# Patient Record
Sex: Female | Born: 1960 | Race: White | Hispanic: No | Marital: Single | Smoking: Former smoker
Health system: Southern US, Community
[De-identification: ages and names within clinical notes are randomized; demographics above are authoritative.]

## PROBLEM LIST (undated history)

## (undated) DIAGNOSIS — T7840XA Allergy, unspecified, initial encounter: Secondary | ICD-10-CM

## (undated) DIAGNOSIS — K219 Gastro-esophageal reflux disease without esophagitis: Secondary | ICD-10-CM

## (undated) DIAGNOSIS — M1990 Unspecified osteoarthritis, unspecified site: Secondary | ICD-10-CM

## (undated) DIAGNOSIS — F419 Anxiety disorder, unspecified: Secondary | ICD-10-CM

## (undated) DIAGNOSIS — R42 Dizziness and giddiness: Secondary | ICD-10-CM

## (undated) HISTORY — DX: Unspecified osteoarthritis, unspecified site: M19.90

## (undated) HISTORY — PX: APPENDECTOMY: SHX54

## (undated) HISTORY — DX: Gastro-esophageal reflux disease without esophagitis: K21.9

## (undated) HISTORY — DX: Allergy, unspecified, initial encounter: T78.40XA

## (undated) HISTORY — DX: Anxiety disorder, unspecified: F41.9

## (undated) HISTORY — PX: TONSILLECTOMY: SUR1361

---

## 2004-09-24 ENCOUNTER — Other Ambulatory Visit: Admission: RE | Admit: 2004-09-24 | Discharge: 2004-09-24 | Payer: Self-pay | Admitting: Obstetrics and Gynecology

## 2005-12-08 ENCOUNTER — Other Ambulatory Visit: Admission: RE | Admit: 2005-12-08 | Discharge: 2005-12-08 | Payer: Self-pay | Admitting: Obstetrics and Gynecology

## 2006-12-13 ENCOUNTER — Other Ambulatory Visit: Admission: RE | Admit: 2006-12-13 | Discharge: 2006-12-13 | Payer: Self-pay | Admitting: Obstetrics and Gynecology

## 2009-04-24 ENCOUNTER — Emergency Department (HOSPITAL_COMMUNITY): Admission: EM | Admit: 2009-04-24 | Discharge: 2009-04-24 | Payer: Self-pay | Admitting: Emergency Medicine

## 2011-02-22 ENCOUNTER — Inpatient Hospital Stay (INDEPENDENT_AMBULATORY_CARE_PROVIDER_SITE_OTHER)
Admission: RE | Admit: 2011-02-22 | Discharge: 2011-02-22 | Disposition: A | Discharge status: Home / Self Care | Payer: BC Managed Care – PPO | Source: Ambulatory Visit | Attending: Emergency Medicine | Admitting: Emergency Medicine

## 2011-02-22 DIAGNOSIS — S99919A Unspecified injury of unspecified ankle, initial encounter: Secondary | ICD-10-CM

## 2011-02-22 DIAGNOSIS — I10 Essential (primary) hypertension: Secondary | ICD-10-CM

## 2011-02-22 DIAGNOSIS — T50995A Adverse effect of other drugs, medicaments and biological substances, initial encounter: Secondary | ICD-10-CM

## 2011-10-07 ENCOUNTER — Emergency Department (HOSPITAL_COMMUNITY)
Admission: EM | Admit: 2011-10-07 | Discharge: 2011-10-07 | Disposition: A | Discharge status: Home / Self Care | Payer: BC Managed Care – PPO | Attending: Emergency Medicine | Admitting: Emergency Medicine

## 2011-10-07 ENCOUNTER — Emergency Department (HOSPITAL_COMMUNITY): Payer: BC Managed Care – PPO

## 2011-10-07 ENCOUNTER — Encounter (HOSPITAL_COMMUNITY): Payer: Self-pay | Admitting: *Deleted

## 2011-10-07 DIAGNOSIS — R1031 Right lower quadrant pain: Secondary | ICD-10-CM | POA: Insufficient documentation

## 2011-10-07 DIAGNOSIS — R319 Hematuria, unspecified: Secondary | ICD-10-CM | POA: Insufficient documentation

## 2011-10-07 DIAGNOSIS — M549 Dorsalgia, unspecified: Secondary | ICD-10-CM | POA: Insufficient documentation

## 2011-10-07 DIAGNOSIS — K625 Hemorrhage of anus and rectum: Secondary | ICD-10-CM | POA: Insufficient documentation

## 2011-10-07 LAB — URINALYSIS, ROUTINE W REFLEX MICROSCOPIC
Bilirubin Urine: NEGATIVE
Glucose, UA: NEGATIVE mg/dL
Ketones, ur: NEGATIVE mg/dL
Protein, ur: NEGATIVE mg/dL

## 2011-10-07 LAB — URINE MICROSCOPIC-ADD ON

## 2011-10-07 NOTE — ED Provider Notes (Signed)
Medical screening examination/treatment/procedure(s) were performed by non-physician practitioner and as supervising physician I was immediately available for consultation/collaboration.  Everett Ehrler L Merri Dimaano, MD 10/07/11 1937 

## 2011-10-07 NOTE — ED Notes (Addendum)
Pt informed that during after urination blood was present on toilet tissue, will notify  Lauralee Evener, PA

## 2011-10-07 NOTE — ED Provider Notes (Signed)
History     CSN: [id number]  Arrival date & time 10/07/11  4098   First MD Initiated Contact with Patient 10/07/11 [phone]      Chief Complaint  Patient presents with  . Rectal Bleeding    (Consider location/radiation/quality/duration/timing/severity/associated sxs/prior treatment) Patient is a 51 y.o. female presenting with hematuria. The history is provided by the patient.  Hematuria This is a new problem. The current episode started today. The problem is unchanged. She describes the hematuria as gross hematuria. She reports no clotting in her urine stream. Pertinent negatives include no chills or fever. (She has seen blood in her urine x 2 today. Initially had RLQ abdominal pain that moved into back before almost completely resolving. No fever, nausea or vomiting. She has a history of kidney stones remotely. She reports she did not have any bowel movements earlier today when seeing blood, only urination.)    History reviewed. No pertinent past medical history.  Past Surgical History  Procedure Date  . Appendectomy     History reviewed. No pertinent family history.  History  Substance Use Topics  . Smoking status: Never Smoker   . Smokeless tobacco: Not on file  . Alcohol Use: No    OB History    Grav Para Term Preterm Abortions TAB SAB Ect Mult Living                  Review of Systems  Constitutional: Negative for fever and chills.  HENT: Negative.   Respiratory: Negative.   Cardiovascular: Negative.   Gastrointestinal: Positive for hematochezia. Negative for blood in stool.  Genitourinary: Positive for hematuria.  Musculoskeletal: Negative.   Skin: Negative.   Neurological: Negative.     Allergies  Neosporin  Home Medications   Current Outpatient Rx  Name Route Sig Dispense Refill  . CALCIUM CARBONATE ANTACID 500 MG PO CHEW Oral Chew 2 tablets by mouth daily as needed. Stomach pain    . CETIRIZINE HCL 5 MG PO TABS Oral Take 5 mg by mouth daily.    .  ADULT MULTIVITAMIN W/MINERALS CH Oral Take 1 tablet by mouth daily.    Marland Kitchen NAPROXEN SODIUM 220 MG PO TABS Oral Take 220 mg by mouth daily.      BP 124/53  Pulse 68  Temp(Src) 98.8 F (37.1 C) (Oral)  Resp 20  SpO2 97%  Physical Exam  Constitutional: She appears well-developed and well-nourished.  HENT:  Head: Normocephalic.  Neck: Normal range of motion. Neck supple.  Cardiovascular: Normal rate and regular rhythm.   Pulmonary/Chest: Effort normal and breath sounds normal.  Abdominal: Soft. Bowel sounds are normal. There is no tenderness. There is no rebound and no guarding.  Musculoskeletal: Normal range of motion.  Neurological: She is alert. No cranial nerve deficit.  Skin: Skin is warm and dry. No rash noted.  Psychiatric: She has a normal mood and affect.    ED Course  Procedures (including critical care time)   Labs Reviewed  URINALYSIS, ROUTINE W REFLEX MICROSCOPIC   No results found.   No diagnosis found.    MDM  Suspect with negative CT for stone, hematuria and pain that are improved and negative urine for infection, that patient passed a kidney stone. Will discharge home with urology follow up.  Rodena Medin, PA-C 10/07/11 1349

## 2011-10-07 NOTE — ED Notes (Signed)
Pt reports right lower quadrant pain that started this am. States she went to the restroom and bright blood in the toilet. Pt states i haven't had my period in 4 years so I put a tampon on in and went to the bathroom again and there was still blood in the toilet. Pt reports mild dizziness. Denies chest pain or palpitations.

## 2011-10-07 NOTE — ED Notes (Addendum)
Pt c/o rectal bleeding that started this am.  Pt denies history of hemorroids.  Denies menstral cycle pt hasnt had period in 4 yrs.  Pt c/o lower abdominal pain and back pain 3/10

## 2011-10-07 NOTE — ED Notes (Signed)
Visual inspection of rectum shows no sign of active bleeding or hemorroids Pt states she has neve had colonoscopy.  Denies history of rectal bleeding in the past.

## 2013-04-12 ENCOUNTER — Emergency Department (HOSPITAL_COMMUNITY)
Admission: EM | Admit: 2013-04-12 | Discharge: 2013-04-12 | Disposition: A | Discharge status: Home / Self Care | Payer: Private Health Insurance - Indemnity | Attending: Emergency Medicine | Admitting: Emergency Medicine

## 2013-04-12 ENCOUNTER — Emergency Department (HOSPITAL_COMMUNITY): Payer: Private Health Insurance - Indemnity

## 2013-04-12 ENCOUNTER — Encounter (HOSPITAL_COMMUNITY): Payer: Self-pay | Admitting: Emergency Medicine

## 2013-04-12 DIAGNOSIS — Z9889 Other specified postprocedural states: Secondary | ICD-10-CM | POA: Insufficient documentation

## 2013-04-12 DIAGNOSIS — R109 Unspecified abdominal pain: Secondary | ICD-10-CM

## 2013-04-12 DIAGNOSIS — Z87891 Personal history of nicotine dependence: Secondary | ICD-10-CM | POA: Insufficient documentation

## 2013-04-12 DIAGNOSIS — M549 Dorsalgia, unspecified: Secondary | ICD-10-CM

## 2013-04-12 DIAGNOSIS — Z87442 Personal history of urinary calculi: Secondary | ICD-10-CM | POA: Insufficient documentation

## 2013-04-12 DIAGNOSIS — M545 Low back pain, unspecified: Secondary | ICD-10-CM | POA: Insufficient documentation

## 2013-04-12 DIAGNOSIS — R1031 Right lower quadrant pain: Secondary | ICD-10-CM | POA: Insufficient documentation

## 2013-04-12 DIAGNOSIS — N390 Urinary tract infection, site not specified: Secondary | ICD-10-CM | POA: Insufficient documentation

## 2013-04-12 LAB — CBC WITH DIFFERENTIAL/PLATELET
Basophils Absolute: 0 10*3/uL (ref 0.0–0.1)
HCT: 43.4 % (ref 36.0–46.0)
Hemoglobin: 14.9 g/dL (ref 12.0–15.0)
Lymphocytes Relative: 27 % (ref 12–46)
Monocytes Absolute: 0.6 10*3/uL (ref 0.1–1.0)
Monocytes Relative: 8 % (ref 3–12)
Neutro Abs: 4.9 10*3/uL (ref 1.7–7.7)
Neutrophils Relative %: 62 % (ref 43–77)
WBC: 7.9 10*3/uL (ref 4.0–10.5)

## 2013-04-12 LAB — BASIC METABOLIC PANEL
BUN: 13 mg/dL (ref 6–23)
CO2: 23 mEq/L (ref 19–32)
Chloride: 105 mEq/L (ref 96–112)
Creatinine, Ser: 0.68 mg/dL (ref 0.50–1.10)
Potassium: 3.8 mEq/L (ref 3.5–5.1)

## 2013-04-12 LAB — URINALYSIS, ROUTINE W REFLEX MICROSCOPIC
Glucose, UA: NEGATIVE mg/dL
Ketones, ur: NEGATIVE mg/dL
Nitrite: NEGATIVE
Specific Gravity, Urine: 1.016 (ref 1.005–1.030)
pH: 5.5 (ref 5.0–8.0)

## 2013-04-12 LAB — URINE MICROSCOPIC-ADD ON

## 2013-04-12 NOTE — ED Notes (Signed)
Pt c/o RLQ abd pain that started on Monday, reports she went to urgent care and was told she had a kidney infection. Was given antibiotics, hasn't finished the dose yet. Reports she has been getting diarrhea and now the pain has worsened. Pt in nad, skin warm and dry, resp e/u. Pt ambulatory.

## 2013-04-12 NOTE — ED Provider Notes (Signed)
CSN: [id number]     Arrival date & time 04/12/13  4098 History     First MD Initiated Contact with Patient 04/12/13 0754     Chief Complaint  Patient presents with  . Abdominal Pain  . Urinary Tract Infection   (Consider location/radiation/quality/duration/timing/severity/associated sxs/prior Treatment) HPI Comments: Caroline Leonard is a 52 y.o. Female who presents for evaluation of abdominal pain. The pain is in the right lower quadrant. It is persistent. She was evaluated at an urgent care center 3 days ago and started on Cipro for UTI. She denies dysuria, urinary frequency, or hematuria. She has had a kidney stone in the past. She is status post appendectomy. She denies fever, chills, nausea, vomiting, weakness, or dizziness. She denies trauma. The pain is dull in nature. There are no other known modifying factors.  Patient is a 52 y.o. female presenting with abdominal pain and urinary tract infection. The history is provided by the patient.  Abdominal Pain Urinary Tract Infection Associated symptoms include abdominal pain.    History reviewed. No pertinent past medical history. Past Surgical History  Procedure Laterality Date  . Appendectomy    . Tonsillectomy     No family history on file. History  Substance Use Topics  . Smoking status: Former Games developer  . Smokeless tobacco: Not on file  . Alcohol Use: Yes     Comment: occasionally.   OB History   Grav Para Term Preterm Abortions TAB SAB Ect Mult Living                 Review of Systems  Gastrointestinal: Positive for abdominal pain.  All other systems reviewed and are negative.    Allergies  Neosporin  Home Medications   Current Outpatient Rx  Name  Route  Sig  Dispense  Refill  . cetirizine (ZYRTEC) 5 MG tablet   Oral   Take 5 mg by mouth daily.         . ciprofloxacin (CIPRO) 500 MG tablet   Oral   Take 500 mg by mouth 2 (two) times daily.         . metroNIDAZOLE (FLAGYL) 250 MG tablet   Oral  Take 250 mg by mouth 3 (three) times daily.          BP 120/80  Pulse 77  Temp(Src) 97.5 F (36.4 C) (Oral)  Resp 18  SpO2 95% Physical Exam  Nursing note and vitals reviewed. Constitutional: She is oriented to person, place, and time. She appears well-developed and well-nourished.  Overweight  HENT:  Head: Normocephalic and atraumatic.  Eyes: Conjunctivae and EOM are normal. Pupils are equal, round, and reactive to light.  Neck: Normal range of motion and phonation normal. Neck supple.  Cardiovascular: Normal rate, regular rhythm and intact distal pulses.   Pulmonary/Chest: Effort normal and breath sounds normal. She exhibits no tenderness.  Abdominal: Soft. She exhibits no distension. There is tenderness (Mild right lower quadrant tenderness). There is no guarding.  Genitourinary:  Mild right costovertebral angle tenderness to percussion  Musculoskeletal: Normal range of motion. She exhibits no edema and no tenderness.  Mild right lumbar tenderness to palpation. Normal range of motion lumbar.  Neurological: She is alert and oriented to person, place, and time. She has normal strength. She exhibits normal muscle tone.  Skin: Skin is warm and dry.  Psychiatric: She has a normal mood and affect. Her behavior is normal. Judgment and thought content normal.    ED Course   Procedures (including critical  care time)  Labs Reviewed  BASIC METABOLIC PANEL - Abnormal; Notable for the following:    Glucose, Bld 115 (*)    All other components within normal limits  URINALYSIS, ROUTINE W REFLEX MICROSCOPIC - Abnormal; Notable for the following:    Leukocytes, UA TRACE (*)    All other components within normal limits  URINE CULTURE  CBC WITH DIFFERENTIAL  URINE MICROSCOPIC-ADD ON   Ct Abdomen Wo Contrast  04/12/2013   *RADIOLOGY REPORT*  Clinical Data:  Abdominal pain  CT  ABDOMEN AND PELVIS WITHOUT CONTRAST  Technique:  Multidetector CT imaging of the abdomen and pelvis was  performed following the standard protocol without IV contrast. Oral contrast was not administered.  Comparison:  October 28, 2011  Findings: There is a calcified granuloma in the posterior segment right upper lobe.  There is mild bibasilar scarring.  There is a small calcified granuloma in the posterior segment of the right lobe of the liver near the dome.  A second small granuloma is noted in the anterior segment left lobe of the liver, midportion.  No other liver lesions are identified on this non intravenous contrast enhanced study.  There are scattered splenic granulomas.  Spleen otherwise appears normal.  Pancreas and adrenals appear normal.  Kidneys bilaterally show no appreciable mass, calculus, or hydronephrosis on either side.  There is no ureteral calculus or ureterectasis on either side.  In the pelvis, there is no mass or fluid collection.  Appendix is absent.  There is no bowel obstruction.  No free air or portal venous air.  There is no ascites, adenopathy, or abscess in the abdomen or pelvis.  There is atherosclerotic change in the aorta but no aneurysm.  There are no blastic or lytic bone lesions.  IMPRESSION: No renal or ureteral calculus.  No hydronephrosis.  Appendix absent.  No bowel obstruction.  No mesenteric inflammation or abscess.  Evidence of prior granulomatous disease.   Original Report Authenticated By: Bretta Bang, M.D.   1. Abdominal pain   2. Back pain     MDM  Nonspecific abdominal and right lower back pain. No evidence for ureteral or kidney urolithiasis. No evidence for colitis, UTI, traumatic injury, shingles, or lumbar myelopathy. Her pain is nonspecific. Doubt metabolic instability, serious bacterial infection or impending vascular collapse; the patient is stable for discharge.  Nursing Notes Reviewed/ Care Coordinated, and agree without changes. Applicable Imaging Reviewed.  Interpretation of Laboratory Data incorporated into ED treatment    Plan: Home  Medications-  OTC analgesics ; Home Treatments and ZOX:WRUE, watch for progressive symptoms- ; return here if the recommended treatment, does not improve the symptoms; Recommended follow up- PCP of choice in one week for repeat urine testing and reevaluation for symptom persistence and arrange ongoing management, at that time.    Flint Melter, MD 04/12/13 [phone]

## 2013-04-13 LAB — URINE CULTURE
Colony Count: NO GROWTH
Culture: NO GROWTH

## 2013-06-06 ENCOUNTER — Ambulatory Visit (INDEPENDENT_AMBULATORY_CARE_PROVIDER_SITE_OTHER): Payer: Private Health Insurance - Indemnity | Admitting: General Surgery

## 2013-06-06 ENCOUNTER — Encounter (INDEPENDENT_AMBULATORY_CARE_PROVIDER_SITE_OTHER): Payer: Self-pay | Admitting: General Surgery

## 2013-06-06 VITALS — BP 128/74 | HR 68 | Temp 97.6°F | Resp 16 | Ht 66.0 in | Wt 216.8 lb

## 2013-06-06 DIAGNOSIS — R1031 Right lower quadrant pain: Secondary | ICD-10-CM

## 2013-06-06 DIAGNOSIS — G8929 Other chronic pain: Secondary | ICD-10-CM | POA: Insufficient documentation

## 2013-06-06 NOTE — Patient Instructions (Addendum)
The pain and lump that you feel in your right lower quadrant seems more related to your old appendectomy scar than an inguinal hernia. They are very close to each other,  however. I cannot push anything back in and so I am not completely sure this is a hernia.  The CT scan of your abdomen and pelvis from August is completely normal. There is no evidence of incisional hernia or inguinal hernia. There is no obvious disease within the abdomen that would cause this pain.  Your recent colonoscopy is also normal. No abnormality of the colon that would cause this problem.  There is no indication for surgery at this point in time.  Return to see Dr. Derrell Lolling in 6 months for reevaluation.Caroline Leonard

## 2013-06-06 NOTE — Progress Notes (Signed)
Patient ID: Caroline Leonard, female   DOB: Jan 02, 1961, 52 y.o.   MRN: [id number]  Chief Complaint  Patient presents with  . New Evaluation    eval RIH    HPI Caroline Leonard is a 52 y.o. female.  She is referred to me by Dr. Charlott Rakes for evaluation of right lower quadrant abdominal pain, suspect hernia.   The patient states that she has had a pain and a lump in her right lower quadrant since July. It doesn't pop in and out. No change in diet, appetite, or bowel habits.  She went to the emergency room and apparently had a CT scan in August. I reviewed this extensively. The abdominal wall muscles are intact. There is no incisional hernia from the right lower quadrant appendectomy scar, and there is no evidence of inguinal hernia. There is no inflammation of the abdominal wall. There is no intra-abdominal pathology seen.  She saw Dr. Bosie Clos. He did a colonoscopy should revealed only hyperplastic polyps.  At that point she was referred to me to see if she had a hernia.  She has no prior history of hernia. She is otherwise healthy other than sinus problems, appendectomy at age 13, and mild obesity. HPI  History reviewed. No pertinent past medical history.  Past Surgical History  Procedure Laterality Date  . Appendectomy    . Tonsillectomy      History reviewed. No pertinent family history.  Social History History  Substance Use Topics  . Smoking status: Former Games developer  . Smokeless tobacco: Never Used  . Alcohol Use: Yes     Comment: occasionally.    Allergies  Allergen Reactions  . Neosporin [Neomycin-Polymyxin-Gramicidin] Other (See Comments)    blisters    Current Outpatient Prescriptions  Medication Sig Dispense Refill  . cetirizine (ZYRTEC) 5 MG tablet Take 5 mg by mouth daily.       No current facility-administered medications for this visit.    Review of Systems Review of Systems  Constitutional: Negative for fever, chills and unexpected weight change.   HENT: Negative for hearing loss, congestion, sore throat, trouble swallowing and voice change.   Eyes: Negative for visual disturbance.  Respiratory: Negative for cough and wheezing.   Cardiovascular: Negative for chest pain, palpitations and leg swelling.  Gastrointestinal: Positive for abdominal pain. Negative for nausea, vomiting, diarrhea, constipation, blood in stool, abdominal distention and anal bleeding.  Genitourinary: Negative for hematuria, vaginal bleeding and difficulty urinating.  Musculoskeletal: Negative for arthralgias.  Skin: Negative for rash and wound.  Neurological: Negative for seizures, syncope and headaches.  Hematological: Negative for adenopathy. Does not bruise/bleed easily.  Psychiatric/Behavioral: Negative for confusion.    Blood pressure 128/74, pulse 68, temperature 97.6 F (36.4 C), temperature source Temporal, resp. rate 16, height 5\' 6"  (1.676 m), weight 216 lb 12.8 oz (98.34 kg).  Physical Exam Physical Exam  Constitutional: She is oriented to person, place, and time. She appears well-developed and well-nourished. No distress.  Mild obesity. Weight 216. Height 5 feet 6. No distress. Friendly and cooperative.  HENT:  Head: Normocephalic and atraumatic.  Nose: Nose normal.  Mouth/Throat: No oropharyngeal exudate.  Eyes: Conjunctivae and EOM are normal. Pupils are equal, round, and reactive to light. Left eye exhibits no discharge. No scleral icterus.  Neck: Neck supple. No JVD present. No tracheal deviation present. No thyromegaly present.  Cardiovascular: Normal rate, regular rhythm, normal heart sounds and intact distal pulses.   No murmur heard. Pulmonary/Chest: Effort normal and breath sounds normal. No  respiratory distress. She has no wheezes. She has no rales. She exhibits no tenderness.  Abdominal: Soft. Bowel sounds are normal. She exhibits no distension and no mass. There is no tenderness. There is no rebound and no guarding.  Well-healed  right lower quadrant scar. Examined supine and standing. I do not feel an inguinal hernia. I do not feel an incisional hernia. There is subjective tenderness in the medial aspect of the appendectomy scar. Perhaps a little bit of thickening. No hernia detected.  Musculoskeletal: She exhibits no edema and no tenderness.  Lymphadenopathy:    She has no cervical adenopathy.  Neurological: She is alert and oriented to person, place, and time. She exhibits normal muscle tone. Coordination normal.  Skin: Skin is warm. No rash noted. She is not diaphoretic. No erythema. No pallor.  Psychiatric: She has a normal mood and affect. Her behavior is normal. Judgment and thought content normal.    Data Reviewed CT scan in August. Colonoscopy report. Dr. Marge Duncans office notes.  Assessment    Right lower quadrant abdominal pain of uncertain etiology. No evidence of hernia. No evidence of inflammatory process of the abdominal wall. No evidence of this inflammatory or obstructive or neoplastic process within the abdominal viscera. Question  musculoskeletal strain  Mild obesity  Remote history of an appendectomy     Plan    There is no indication for surgical intervention or further testing at this time.  Return to see me in 6 months to make sure that she's not developing a hernia or some other abnormality of the abdominal wall.        Angelia Mould. Derrell Lolling, M.D., [facility] General and Minimally invasive Surgery Breast and Colorectal Surgery Office:   [phone] Pager:   [phone]  06/06/2013, 11:29 AM

## 2013-06-21 ENCOUNTER — Encounter (INDEPENDENT_AMBULATORY_CARE_PROVIDER_SITE_OTHER): Payer: Self-pay

## 2013-07-04 ENCOUNTER — Encounter (INDEPENDENT_AMBULATORY_CARE_PROVIDER_SITE_OTHER): Payer: Private Health Insurance - Indemnity | Admitting: General Surgery

## 2013-10-22 ENCOUNTER — Emergency Department (INDEPENDENT_AMBULATORY_CARE_PROVIDER_SITE_OTHER)
Admission: EM | Admit: 2013-10-22 | Discharge: 2013-10-22 | Disposition: A | Discharge status: Home / Self Care | Payer: Private Health Insurance - Indemnity | Source: Home / Self Care | Attending: Family Medicine | Admitting: Family Medicine

## 2013-10-22 ENCOUNTER — Emergency Department (INDEPENDENT_AMBULATORY_CARE_PROVIDER_SITE_OTHER): Payer: Private Health Insurance - Indemnity

## 2013-10-22 ENCOUNTER — Encounter (HOSPITAL_COMMUNITY): Payer: Self-pay | Admitting: Emergency Medicine

## 2013-10-22 DIAGNOSIS — J069 Acute upper respiratory infection, unspecified: Secondary | ICD-10-CM

## 2013-10-22 MED ORDER — IPRATROPIUM BROMIDE 0.06 % NA SOLN: 2.0000 | Freq: Four times a day (QID) | NASAL | Status: DC

## 2013-10-22 MED ORDER — IPRATROPIUM BROMIDE 0.02 % IN SOLN
0.5000 mg | Freq: Once | RESPIRATORY_TRACT | Status: AC
  Administered 2013-10-22: 0.5 mg via RESPIRATORY_TRACT

## 2013-10-22 MED ORDER — ALBUTEROL SULFATE (2.5 MG/3ML) 0.083% IN NEBU
5.0000 mg | INHALATION_SOLUTION | Freq: Once | RESPIRATORY_TRACT | Status: AC
  Administered 2013-10-22: 5 mg via RESPIRATORY_TRACT

## 2013-10-22 MED ORDER — IPRATROPIUM BROMIDE 0.02 % IN SOLN
RESPIRATORY_TRACT | Status: AC
  Filled 2013-10-22: qty 2.5

## 2013-10-22 MED ORDER — ALBUTEROL SULFATE HFA 108 (90 BASE) MCG/ACT IN AERS
1.0000 | INHALATION_SPRAY | Freq: Four times a day (QID) | RESPIRATORY_TRACT | Status: DC | PRN
Start: 2013-10-22 — End: 2024-05-10

## 2013-10-22 MED ORDER — ALBUTEROL SULFATE (2.5 MG/3ML) 0.083% IN NEBU
INHALATION_SOLUTION | RESPIRATORY_TRACT | Status: AC
  Filled 2013-10-22: qty 6

## 2013-10-22 NOTE — Discharge Instructions (Signed)
Your chest xray was normal. Please use albuterol and atrovent as prescribed in addition to those medications you are already taking and follow up with your doctor if your symptoms do not improve.

## 2013-10-22 NOTE — ED Notes (Signed)
C/o bronchitis   States she was seen Wednesday at a urgent care and was dx with Bronchitis.  Did receive medication   States she is not feeling any better   States she is having sob, sneezing, diarrhea, right ear pressure and dry cough Patient is still taking antibiotic

## 2013-10-22 NOTE — ED Provider Notes (Signed)
CSN: [id number]     Arrival date & time 10/22/13  1119 History   First MD Initiated Contact with Patient 10/22/13 1212     Chief Complaint  Patient presents with  . Bronchitis     (Consider location/radiation/quality/duration/timing/severity/associated sxs/prior Treatment) HPI Comments: Patient presents with 1 week history of cough with associated nasal congestion. States she was seen for same at [facility] on 10/18/2013 and diagnosed with sinusitis and bronchitis and prescribed amoxicillin, prednisone and cough suppressant. States she still feels unwell and comes here for re-evaluation. Patient is former smoker. Denies fever or hemoptysis. No N/V but states some diarrhea since beginning amoxicillin.  PCP: Dr. Marcy Siren  The history is provided by the patient.    History reviewed. No pertinent past medical history. Past Surgical History  Procedure Laterality Date  . Appendectomy    . Tonsillectomy     History reviewed. No pertinent family history. History  Substance Use Topics  . Smoking status: Former Games developer  . Smokeless tobacco: Never Used  . Alcohol Use: Yes     Comment: occasionally.   OB History   Grav Para Term Preterm Abortions TAB SAB Ect Mult Living                 Review of Systems  All other systems reviewed and are negative.      Allergies  Neosporin  Home Medications   Current Outpatient Rx  Name  Route  Sig  Dispense  Refill  . cetirizine (ZYRTEC) 5 MG tablet   Oral   Take 5 mg by mouth daily.          BP 138/86  Pulse 75  Temp(Src) 97.9 F (36.6 C) (Oral)  Resp 19  SpO2 96% Physical Exam  Nursing note and vitals reviewed. Constitutional: She is oriented to person, place, and time. She appears well-developed and well-nourished. No distress.  +overweight  HENT:  Head: Normocephalic and atraumatic.  Right Ear: Hearing, tympanic membrane, external ear and ear canal normal.  Left Ear: Hearing, tympanic membrane, external ear and  ear canal normal.  Nose: Nose normal.  Mouth/Throat: Uvula is midline, oropharynx is clear and moist and mucous membranes are normal.  Eyes: Conjunctivae are normal. Right eye exhibits no discharge. Left eye exhibits no discharge. No scleral icterus.  Neck: Normal range of motion. Neck supple.  Cardiovascular: Normal rate, regular rhythm and normal heart sounds.   Pulmonary/Chest: Effort normal and breath sounds normal.  Musculoskeletal: Normal range of motion. She exhibits no edema.  Neurological: She is alert and oriented to person, place, and time.  Skin: Skin is warm and dry.  Psychiatric: She has a normal mood and affect. Her behavior is normal.    ED Course  Procedures (including critical care time) Labs Review Labs Reviewed - No data to display Imaging Review Dg Chest 2 View  10/22/2013   CLINICAL DATA:  Cough  EXAM: CHEST  2 VIEW  COMPARISON:  None.  FINDINGS: The heart size and mediastinal contours are within normal limits. Both lungs are clear. The visualized skeletal structures are unremarkable. Stable calcified right middle lobe granuloma.  IMPRESSION: No active cardiopulmonary disease.   Electronically Signed   By: Ruel Favors M.D.   On: 10/22/2013 12:52      MDM   Final diagnoses:  None  CXR unremarkable for acute illness. Will add albuterol MDI to try to help with cough and advise using Atrovent nasal spray for congestion. Patient instructed to follow  up with PCP if no improvement over next 3-5 days.   Jess Barters [place], [place] 10/23/13 [phone]

## 2013-10-23 NOTE — ED Provider Notes (Signed)
Medical screening examination/treatment/procedure(s) were performed by a resident physician or non-physician practitioner and as the supervising physician I was immediately available for consultation/collaboration.  Clementeen Graham, MD    Rodolph Bong, MD 10/23/13 2019

## 2013-12-20 ENCOUNTER — Ambulatory Visit (INDEPENDENT_AMBULATORY_CARE_PROVIDER_SITE_OTHER): Payer: Private Health Insurance - Indemnity | Admitting: General Surgery

## 2014-08-06 ENCOUNTER — Encounter (HOSPITAL_COMMUNITY): Payer: Self-pay

## 2014-08-06 ENCOUNTER — Emergency Department (HOSPITAL_COMMUNITY)
Admission: EM | Admit: 2014-08-06 | Discharge: 2014-08-06 | Disposition: A | Discharge status: Home / Self Care | Payer: Private Health Insurance - Indemnity | Attending: Emergency Medicine | Admitting: Emergency Medicine

## 2014-08-06 ENCOUNTER — Emergency Department (HOSPITAL_COMMUNITY): Payer: Private Health Insurance - Indemnity

## 2014-08-06 DIAGNOSIS — Z791 Long term (current) use of non-steroidal anti-inflammatories (NSAID): Secondary | ICD-10-CM | POA: Insufficient documentation

## 2014-08-06 DIAGNOSIS — Z87891 Personal history of nicotine dependence: Secondary | ICD-10-CM | POA: Insufficient documentation

## 2014-08-06 DIAGNOSIS — R0602 Shortness of breath: Secondary | ICD-10-CM | POA: Diagnosis not present

## 2014-08-06 DIAGNOSIS — Z79899 Other long term (current) drug therapy: Secondary | ICD-10-CM | POA: Insufficient documentation

## 2014-08-06 DIAGNOSIS — R079 Chest pain, unspecified: Secondary | ICD-10-CM | POA: Insufficient documentation

## 2014-08-06 LAB — CBC
HCT: 41.9 % (ref 36.0–46.0)
Hemoglobin: 14.4 g/dL (ref 12.0–15.0)
MCH: 30.5 pg (ref 26.0–34.0)
MCHC: 34.4 g/dL (ref 30.0–36.0)
MCV: 88.8 fL (ref 78.0–100.0)
PLATELETS: 271 10*3/uL (ref 150–400)
RBC: 4.72 MIL/uL (ref 3.87–5.11)
RDW: 13 % (ref 11.5–15.5)
WBC: 6.7 10*3/uL (ref 4.0–10.5)

## 2014-08-06 LAB — D-DIMER, QUANTITATIVE: D-Dimer, Quant: 0.53 ug/mL-FEU — ABNORMAL HIGH (ref 0.00–0.48)

## 2014-08-06 LAB — BASIC METABOLIC PANEL
ANION GAP: 14 (ref 5–15)
BUN: 12 mg/dL (ref 6–23)
CHLORIDE: 103 meq/L (ref 96–112)
CO2: 25 meq/L (ref 19–32)
Calcium: 9.6 mg/dL (ref 8.4–10.5)
Creatinine, Ser: 0.77 mg/dL (ref 0.50–1.10)
GFR calc non Af Amer: >90 mL/min (ref >90)
Glucose, Bld: 114 mg/dL — ABNORMAL HIGH (ref 70–99)
POTASSIUM: 3.6 meq/L — AB (ref 3.7–5.3)
Sodium: 142 mEq/L (ref 137–147)

## 2014-08-06 LAB — I-STAT TROPONIN, ED: TROPONIN I, POC: 0 ng/mL (ref 0.00–0.08)

## 2014-08-06 LAB — TROPONIN I

## 2014-08-06 MED ORDER — KETOROLAC TROMETHAMINE 60 MG/2ML IM SOLN: 60.0000 mg | Freq: Once | INTRAMUSCULAR | Status: DC

## 2014-08-06 MED ORDER — KETOROLAC TROMETHAMINE 30 MG/ML IJ SOLN
30.0000 mg | Freq: Once | INTRAMUSCULAR | Status: AC
  Administered 2014-08-06: 30 mg via INTRAVENOUS
  Filled 2014-08-06: qty 1

## 2014-08-06 MED ORDER — IBUPROFEN 600 MG PO TABS: 600.0000 mg | ORAL_TABLET | Freq: Four times a day (QID) | ORAL | Status: DC | PRN

## 2014-08-06 NOTE — ED Notes (Signed)
Pt reports left sided chest pain with radiation to neck and left arm, with mild SOB since saturday. States hx of neck pain and took a flexeril with no relief. Denies N/V or diaphoresis. Pt. Alert and oriented x4, no distress noted at triage.

## 2014-08-06 NOTE — ED Provider Notes (Signed)
53 year old female without cardiac risk factors has been having episodic left-sided chest pain with radiation to the neck and arm. This been going on for the last 3 days. Episodes last 2-3 seconds at a time. She does describe a squeezing sensation which he rates at 8/10 when present. It is not exertional and not related to body position. There is some associated dyspnea and diaphoresis but no nausea. On exam, she does have tenderness to palpation in the left paracervical muscles and in the left side of the chest although this does not quite reproduce her pain. ECG shows minimal ST depression but not in a pattern that is suggestive of ischemia. Troponin is come back negative. She will need stress testing which probably can be obtained as an outpatient.  I saw and evaluated the patient, reviewed the resident's note and I agree with the findings and plan.   EKG Interpretation   Date/Time:  Monday August 06 2014 13:02:23 EST Ventricular Rate:  92 PR Interval:  128 QRS Duration: 74 QT Interval:  352 QTC Calculation: 435 R Axis:   61 Text Interpretation:  Normal sinus rhythm Nonspecific ST abnormality  Abnormal ECG No old tracing to compare Confirmed by [facility]  MD, Zan Orlick  ([id number]) on 08/06/2014 3:23:37 PM        Dione Booze, MD 08/07/14 [phone]

## 2014-08-06 NOTE — ED Notes (Signed)
MD at bedside. 

## 2014-08-06 NOTE — ED Provider Notes (Signed)
CSN: [id number]     Arrival date & time 08/06/14  1256 History   First MD Initiated Contact with Patient 08/06/14 1522     Chief Complaint  Patient presents with  . Chest Pain     (Consider location/radiation/quality/duration/timing/severity/associated sxs/prior Treatment) Patient is a 53 y.o. female presenting with shoulder pain.  Shoulder Pain Location:  Shoulder Shoulder location:  L shoulder Pain details:    Quality:  Sharp   Radiates to:  L shoulder   Severity:  Moderate   Onset quality:  Gradual   Duration:  2 days   Timing:  Intermittent Chronicity:  New Dislocation: no   Foreign body present:  No foreign bodies Associated symptoms: no back pain, no fatigue and no fever     History reviewed. No pertinent past medical history. Past Surgical History  Procedure Laterality Date  . Appendectomy    . Tonsillectomy     No family history on file. History  Substance Use Topics  . Smoking status: Former Games developer  . Smokeless tobacco: Never Used  . Alcohol Use: Yes     Comment: occasionally.   OB History    No data available     Review of Systems  Constitutional: Negative for fever, activity change and fatigue.  Eyes: Negative for photophobia and pain.  Respiratory: Positive for shortness of breath (with ambulating).   Cardiovascular: Negative for chest pain, palpitations and leg swelling.  Gastrointestinal: Negative for nausea.  Endocrine: Negative for polydipsia and polyuria.  Musculoskeletal: Negative for back pain.  All other systems reviewed and are negative.     Allergies  Neosporin  Home Medications   Prior to Admission medications   Medication Sig Start Date End Date Taking? Authorizing Provider  cetirizine (ZYRTEC) 5 MG tablet Take 5 mg by mouth daily.   Yes Historical Provider, MD  cyclobenzaprine (FLEXERIL) 10 MG tablet Take 10 mg by mouth 3 (three) times daily as needed. 07/30/14  Yes Historical Provider, MD  meloxicam (MOBIC) 15 MG tablet Take  15 mg by mouth daily. 07/30/14  Yes Historical Provider, MD  albuterol (PROVENTIL HFA;VENTOLIN HFA) 108 (90 BASE) MCG/ACT inhaler Inhale 1-2 puffs into the lungs every 6 (six) hours as needed for wheezing or shortness of breath (or persistent coughing). Patient not taking: Reported on 08/06/2014 10/22/13   Mathis Fare Presson, PA  ibuprofen (ADVIL,MOTRIN) 600 MG tablet Take 1 tablet (600 mg total) by mouth every 6 (six) hours as needed. 08/06/14   Marily Memos, MD  ipratropium (ATROVENT) 0.06 % nasal spray Place 2 sprays into both nostrils 4 (four) times daily. Patient not taking: Reported on 08/06/2014 10/22/13   Ria Clock, PA  traMADol (ULTRAM) 50 MG tablet Take 1 tablet by mouth every 6 (six) hours as needed. 07/30/14   Historical Provider, MD   BP 115/75 mmHg  Pulse 78  Temp(Src) 97.8 F (36.6 C) (Oral)  Resp 14  Ht 5\' 6"  (1.676 m)  Wt 214 lb (97.07 kg)  BMI 34.56 kg/m2  SpO2 98% Physical Exam  Constitutional: She is oriented to person, place, and time. She appears well-developed and well-nourished.  HENT:  Head: Normocephalic and atraumatic.  Eyes: Conjunctivae and EOM are normal. Right eye exhibits no discharge. Left eye exhibits no discharge.  Cardiovascular: Normal rate and regular rhythm.   Pulmonary/Chest: Effort normal and breath sounds normal. No respiratory distress.  Abdominal: Soft. She exhibits no distension. There is no tenderness. There is no rebound.  Musculoskeletal: Normal range of motion.  She exhibits no edema or tenderness.  Pain is reproduced with neck twisting. It is the exact pain that she has felt intermittently over the last couple days. Radiates down to her shoulder and left arm a little bit.  Neurological: She is alert and oriented to person, place, and time.  Skin: Skin is warm and dry.  Nursing note and vitals reviewed.   ED Course  Procedures (including critical care time) Labs Review Labs Reviewed  BASIC METABOLIC PANEL - Abnormal;  Notable for the following:    Potassium 3.6 (*)    Glucose, Bld 114 (*)    All other components within normal limits  D-DIMER, QUANTITATIVE - Abnormal; Notable for the following:    D-Dimer, Quant 0.53 (*)    All other components within normal limits  CBC  TROPONIN I  I-STAT TROPOININ, ED    Imaging Review Dg Chest 2 View  08/06/2014   CLINICAL DATA:  Left-sided chest pain and shortness of breath for 2 days  EXAM: CHEST  2 VIEW  COMPARISON:  10/22/2013  FINDINGS: Cardiac shadow is stable. A calcified granuloma is again noted in the right mid lung. Minimal platelike scarring is noted in the left lung base stable from the prior exam. No new focal infiltrate or sizable effusion is seen. No acute bony abnormality is noted.  IMPRESSION: Chronic changes without acute abnormality.   Electronically Signed   By: Alcide Clever M.D.   On: 08/06/2014 15:28     EKG Interpretation   Date/Time:  Monday August 06 2014 13:02:23 EST Ventricular Rate:  92 PR Interval:  128 QRS Duration: 74 QT Interval:  352 QTC Calculation: 435 R Axis:   61 Text Interpretation:  Normal sinus rhythm Nonspecific ST abnormality  Abnormal ECG No old tracing to compare Confirmed by [facility]  MD, DAVID  ([id number]) on 08/06/2014 3:23:37 PM      MDM   Final diagnoses:  Chest pain    53 year old female with left neck pain that radiates down into her shoulder partially 2-3 seconds at a time intermittently throughout the day not related to exertion. Started after pain Christmas lights and decorate for Christmas and Saturday. Reproduced with movement of the neck. Vital signs normal. No nausea vomiting diaphoresis with these events. No recent cough fever. She says no shortness of breath however sometimes she does feel she agrees of the faster while walking. However this symptom is not brought on by walking. On exam patient appears well. EKG initially had nonspecific changes however was a poor baseline so repeat EKGs were done which  were normal. Patient ambulated did not have recurrence of chest pain or hypoxia. Posterior EKG was also normal. Excellent patient has a minimally suspicious story for ACS heart scores low-dose with adult troponins rule out acute syndrome and will follow-up with cardiology for stress testing. -Wells of 0 however is not it will be excluded via per criteria secondary to age W d-dimer and treat appropriately. -Likely related to MS K we will treat her with NSAIDs and have her follow up with PCP for this.  Age adjusted d dimer WNL. troponins negative. ECG ok. Likely MSK, will go to cardiology for follow up.   Marily Memos, MD 08/06/14 [phone]

## 2014-08-06 NOTE — ED Notes (Signed)
Pt pulse ox 94-97 while ambulating.

## 2015-01-30 DIAGNOSIS — M06041 Rheumatoid arthritis without rheumatoid factor, right hand: Secondary | ICD-10-CM | POA: Insufficient documentation

## 2015-03-25 ENCOUNTER — Emergency Department (HOSPITAL_COMMUNITY): Payer: Managed Care, Other (non HMO)

## 2015-03-25 ENCOUNTER — Emergency Department (HOSPITAL_COMMUNITY)
Admission: EM | Admit: 2015-03-25 | Discharge: 2015-03-25 | Disposition: A | Discharge status: Home / Self Care | Payer: Managed Care, Other (non HMO) | Attending: Emergency Medicine | Admitting: Emergency Medicine

## 2015-03-25 ENCOUNTER — Encounter (HOSPITAL_COMMUNITY): Payer: Self-pay | Admitting: Family Medicine

## 2015-03-25 DIAGNOSIS — Z87891 Personal history of nicotine dependence: Secondary | ICD-10-CM | POA: Insufficient documentation

## 2015-03-25 DIAGNOSIS — R42 Dizziness and giddiness: Secondary | ICD-10-CM

## 2015-03-25 DIAGNOSIS — Z79899 Other long term (current) drug therapy: Secondary | ICD-10-CM | POA: Insufficient documentation

## 2015-03-25 HISTORY — DX: Dizziness and giddiness: R42

## 2015-03-25 LAB — URINALYSIS, ROUTINE W REFLEX MICROSCOPIC
Bilirubin Urine: NEGATIVE
Glucose, UA: NEGATIVE mg/dL
Hgb urine dipstick: NEGATIVE
Ketones, ur: NEGATIVE mg/dL
Nitrite: NEGATIVE
PROTEIN: NEGATIVE mg/dL
SPECIFIC GRAVITY, URINE: 1.004 — AB (ref 1.005–1.030)
UROBILINOGEN UA: 0.2 mg/dL (ref 0.0–1.0)
pH: 7.5 (ref 5.0–8.0)

## 2015-03-25 LAB — COMPREHENSIVE METABOLIC PANEL
ALT: 19 U/L (ref 14–54)
AST: 19 U/L (ref 15–41)
Albumin: 3.5 g/dL (ref 3.5–5.0)
Alkaline Phosphatase: 68 U/L (ref 38–126)
Anion gap: 8 (ref 5–15)
BUN: 10 mg/dL (ref 6–20)
CO2: 25 mmol/L (ref 22–32)
Calcium: 9.2 mg/dL (ref 8.9–10.3)
Chloride: 106 mmol/L (ref 101–111)
Creatinine, Ser: 0.62 mg/dL (ref 0.44–1.00)
GFR calc non Af Amer: >60 mL/min (ref >60)
Glucose, Bld: 101 mg/dL — ABNORMAL HIGH (ref 65–99)
Potassium: 4 mmol/L (ref 3.5–5.1)
SODIUM: 139 mmol/L (ref 135–145)
TOTAL PROTEIN: 6.6 g/dL (ref 6.5–8.1)
Total Bilirubin: 0.6 mg/dL (ref 0.3–1.2)

## 2015-03-25 LAB — URINE MICROSCOPIC-ADD ON

## 2015-03-25 LAB — CBC WITH DIFFERENTIAL/PLATELET
BASOS ABS: 0 10*3/uL (ref 0.0–0.1)
Basophils Relative: 0 % (ref 0–1)
EOS ABS: 0.1 10*3/uL (ref 0.0–0.7)
EOS PCT: 1 % (ref 0–5)
HCT: 42.1 % (ref 36.0–46.0)
Hemoglobin: 14.1 g/dL (ref 12.0–15.0)
LYMPHS ABS: 2.1 10*3/uL (ref 0.7–4.0)
LYMPHS PCT: 20 % (ref 12–46)
MCH: 29.8 pg (ref 26.0–34.0)
MCHC: 33.5 g/dL (ref 30.0–36.0)
MCV: 89 fL (ref 78.0–100.0)
MONO ABS: 0.6 10*3/uL (ref 0.1–1.0)
MONOS PCT: 5 % (ref 3–12)
NEUTROS ABS: 8.1 10*3/uL — AB (ref 1.7–7.7)
NEUTROS PCT: 74 % (ref 43–77)
PLATELETS: 283 10*3/uL (ref 150–400)
RBC: 4.73 MIL/uL (ref 3.87–5.11)
RDW: 13.6 % (ref 11.5–15.5)
WBC: 10.9 10*3/uL — ABNORMAL HIGH (ref 4.0–10.5)

## 2015-03-25 MED ORDER — ONDANSETRON HCL 4 MG PO TABS: 4.0000 mg | ORAL_TABLET | Freq: Four times a day (QID) | ORAL | Status: DC

## 2015-03-25 MED ORDER — MECLIZINE HCL 25 MG PO TABS
25.0000 mg | ORAL_TABLET | Freq: Once | ORAL | Status: AC
  Administered 2015-03-25: 25 mg via ORAL
  Filled 2015-03-25: qty 1

## 2015-03-25 MED ORDER — SODIUM CHLORIDE 0.9 % IV SOLN
INTRAVENOUS | Status: DC
  Administered 2015-03-25: 10:00:00 via INTRAVENOUS

## 2015-03-25 MED ORDER — ONDANSETRON HCL 4 MG/2ML IJ SOLN
4.0000 mg | Freq: Once | INTRAMUSCULAR | Status: AC
  Administered 2015-03-25: 4 mg via INTRAVENOUS
  Filled 2015-03-25: qty 2

## 2015-03-25 MED ORDER — MECLIZINE HCL 25 MG PO TABS: 25.0000 mg | ORAL_TABLET | Freq: Four times a day (QID) | ORAL | Status: DC

## 2015-03-25 NOTE — ED Notes (Signed)
Pt here for intermittent dizziness since Saturday. sts she has been taking dramamine. sts she went to work this am and it came back. sts she feels like the room is spinning and nausea yesterday.

## 2015-03-25 NOTE — ED Notes (Signed)
Patient transported to CT 

## 2015-03-25 NOTE — Discharge Instructions (Signed)
Benign Positional Vertigo °Vertigo means you feel like you or your surroundings are moving when they are not. Benign positional vertigo is the most common form of vertigo. Benign means that the cause of your condition is not serious. Benign positional vertigo is more common in older adults. °CAUSES  °Benign positional vertigo is the result of an upset in the labyrinth system. This is an area in the middle ear that helps control your balance. This may be caused by a viral infection, head injury, or repetitive motion. However, often no specific cause is found. °SYMPTOMS  °Symptoms of benign positional vertigo occur when you move your head or eyes in different directions. Some of the symptoms may include: °· Loss of balance and falls. °· Vomiting. °· Blurred vision. °· Dizziness. °· Nausea. °· Involuntary eye movements (nystagmus). °DIAGNOSIS  °Benign positional vertigo is usually diagnosed by physical exam. If the specific cause of your benign positional vertigo is unknown, your caregiver may perform imaging tests, such as magnetic resonance imaging (MRI) or computed tomography (CT). °TREATMENT  °Your caregiver may recommend movements or procedures to correct the benign positional vertigo. Medicines such as meclizine, benzodiazepines, and medicines for nausea may be used to treat your symptoms. In rare cases, if your symptoms are caused by certain conditions that affect the inner ear, you may need surgery. °HOME CARE INSTRUCTIONS  °· Follow your caregiver's instructions. °· Move slowly. Do not make sudden body or head movements. °· Avoid driving. °· Avoid operating heavy machinery. °· Avoid performing any tasks that would be dangerous to you or others during a vertigo episode. °· Drink enough fluids to keep your urine clear or pale yellow. °SEEK IMMEDIATE MEDICAL CARE IF:  °· You develop problems with walking, weakness, numbness, or using your arms, hands, or legs. °· You have difficulty speaking. °· You develop  severe headaches. °· Your nausea or vomiting continues or gets worse. °· You develop visual changes. °· Your family or friends notice any behavioral changes. °· Your condition gets worse. °· You have a fever. °· You develop a stiff neck or sensitivity to light. °MAKE SURE YOU:  °· Understand these instructions. °· Will watch your condition. °· Will get help right away if you are not doing well or get worse. °Document Released: 05/25/2006 Document Revised: 11/09/2011 Document Reviewed: 05/07/2011 °ExitCare® Patient Information ©2015 [employer]. This information is not intended to replace advice given to you by your health care provider. Make sure you discuss any questions you have with your health care provider. ° °Dizziness °Dizziness is a common problem. It is a feeling of unsteadiness or light-headedness. You may feel like you are about to faint. Dizziness can lead to injury if you stumble or fall. A person of any age group can suffer from dizziness, but dizziness is more common in older adults. °CAUSES  °Dizziness can be caused by many different things, including: °· Middle ear problems. °· Standing for too long. °· Infections. °· An allergic reaction. °· Aging. °· An emotional response to something, such as the sight of blood. °· Side effects of medicines. °· Tiredness. °· Problems with circulation or blood pressure. °· Excessive use of alcohol or medicines, or illegal drug use. °· Breathing too fast (hyperventilation). °· An irregular heart rhythm (arrhythmia). °· A low red blood cell count (anemia). °· Pregnancy. °· Vomiting, diarrhea, fever, or other illnesses that cause body fluid loss (dehydration). °· Diseases or conditions such as Parkinson's disease, high blood pressure (hypertension), diabetes, and thyroid problems. °·   Exposure to extreme heat. °DIAGNOSIS  °Your health care provider will ask about your symptoms, perform a physical exam, and perform an electrocardiogram (ECG) to record the electrical  activity of your heart. Your health care provider may also perform other heart or blood tests to determine the cause of your dizziness. These may include: °· Transthoracic echocardiogram (TTE). During echocardiography, sound waves are used to evaluate how blood flows through your heart. °· Transesophageal echocardiogram (TEE). °· Cardiac monitoring. This allows your health care provider to monitor your heart rate and rhythm in real time. °· Holter monitor. This is a portable device that records your heartbeat and can help diagnose heart arrhythmias. It allows your health care provider to track your heart activity for several days if needed. °· Stress tests by exercise or by giving medicine that makes the heart beat faster. °TREATMENT  °Treatment of dizziness depends on the cause of your symptoms and can vary greatly. °HOME CARE INSTRUCTIONS  °· Drink enough fluids to keep your urine clear or pale yellow. This is especially important in very hot weather. In older adults, it is also important in cold weather. °· Take your medicine exactly as directed if your dizziness is caused by medicines. When taking blood pressure medicines, it is especially important to get up slowly. °¨ Rise slowly from chairs and steady yourself until you feel okay. °¨ In the morning, first sit up on the side of the bed. When you feel okay, stand slowly while holding onto something until you know your balance is fine. °· Move your legs often if you need to stand in one place for a long time. Tighten and relax your muscles in your legs while standing. °· Have someone stay with you for 1-2 days if dizziness continues to be a problem. Do this until you feel you are well enough to stay alone. Have the person call your health care provider if he or she notices changes in you that are concerning. °· Do not drive or use heavy machinery if you feel dizzy. °· Do not drink alcohol. °SEEK IMMEDIATE MEDICAL CARE IF:  °· Your dizziness or light-headedness  gets worse. °· You feel nauseous or vomit. °· You have problems talking, walking, or using your arms, hands, or legs. °· You feel weak. °· You are not thinking clearly or you have trouble forming sentences. It may take a friend or family member to notice this. °· You have chest pain, abdominal pain, shortness of breath, or sweating. °· Your vision changes. °· You notice any bleeding. °· You have side effects from medicine that seems to be getting worse rather than better. °MAKE SURE YOU:  °· Understand these instructions. °· Will watch your condition. °· Will get help right away if you are not doing well or get worse. °Document Released: 02/10/2001 Document Revised: 08/22/2013 Document Reviewed: 03/06/2011 °ExitCare® Patient Information ©2015 [employer]. This information is not intended to replace advice given to you by your health care provider. Make sure you discuss any questions you have with your health care provider. ° °

## 2015-03-25 NOTE — ED Provider Notes (Signed)
CSN: [id number]     Arrival date & time 03/25/15  0828 History   First MD Initiated Contact with Patient 03/25/15 0915     Chief Complaint  Patient presents with  . Dizziness     (Consider location/radiation/quality/duration/timing/severity/associated sxs/prior Treatment) HPI    PCP: Leanor Rubenstein, MD Blood pressure 125/84, pulse 71, temperature 98 F (36.7 C), resp. rate 11, SpO2 97 %.  Caroline Leonard is a 54 y.o.female with a significant PMH of allergies and asthma presents to the ER with complaints of dizziness since Saturday. Past medical history reports a history of vertigo but the patient denies this and says she has never felt dizzy before. On Saturday she developed dizziness and difficulty with her balance took Dramamine which made her drowsy and just let most of Saturday. She woke up Sunday feeling well, went to church and while she was there she developed symptoms can. She went home had a couple episodes of vomiting took some more Dramamine fell sleep. She woke up feeling better again this morning and while at work had acute onset of the dizziness again. She has improvement of symptoms as long she is sitting up at 45 degree angle and holding still. Walking, laying flat, moving her head from side to side or bending over severely exacerbate the pain and she becomes nauseous. She is in the ED today for evaluation and treatment of the dizziness.  The patient denies diaphoresis, fever, headache, weakness (general or focal), confusion, change of vision,  neck pain, dysphagia, aphagia, chest pain, shortness of breath, back pain, abdominal pains, nausea, vomiting, diarrhea, lower extremity swelling, rash.  Past Medical History  Diagnosis Date  . Vertigo    Past Surgical History  Procedure Laterality Date  . Appendectomy    . Tonsillectomy     History reviewed. No pertinent family history. History  Substance Use Topics  . Smoking status: Former Games developer  . Smokeless tobacco: Never  Used  . Alcohol Use: Yes     Comment: occasionally.   OB History    No data available     Review of Systems  10 Systems reviewed and are negative for acute change except as noted in the HPI.   Allergies  Neosporin  Home Medications   Prior to Admission medications   Medication Sig Start Date End Date Taking? Authorizing Provider  cetirizine (ZYRTEC) 5 MG tablet Take 5 mg by mouth daily.   Yes Historical Provider, MD  Cholecalciferol (VITAMIN D PO) Take 1 tablet by mouth daily.   Yes Historical Provider, MD  cyclobenzaprine (FLEXERIL) 10 MG tablet Take 10 mg by mouth 3 (three) times daily as needed for muscle spasms.  07/30/14  Yes Historical Provider, MD  dimenhyDRINATE (DRAMAMINE) 50 MG tablet Take 50 mg by mouth every 8 (eight) hours as needed for dizziness.   Yes Historical Provider, MD  ibuprofen (ADVIL,MOTRIN) 600 MG tablet Take 1 tablet (600 mg total) by mouth every 6 (six) hours as needed. Patient taking differently: Take 600 mg by mouth every 6 (six) hours as needed (pain).  08/06/14  Yes Marily Memos, MD  Omega-3 Fatty Acids (OMEGA-3 FISH OIL PO) Take 1 tablet by mouth daily.   Yes Historical Provider, MD  vitamin E 100 UNIT capsule Take 100 Units by mouth daily.   Yes Historical Provider, MD  albuterol (PROVENTIL HFA;VENTOLIN HFA) 108 (90 BASE) MCG/ACT inhaler Inhale 1-2 puffs into the lungs every 6 (six) hours as needed for wheezing or shortness of breath (or persistent coughing).  Patient not taking: Reported on 08/06/2014 10/22/13   Mathis Fare Presson, PA  ipratropium (ATROVENT) 0.06 % nasal spray Place 2 sprays into both nostrils 4 (four) times daily. Patient not taking: Reported on 08/06/2014 10/22/13   Ria Clock, PA  meclizine (ANTIVERT) 25 MG tablet Take 1 tablet (25 mg total) by mouth 4 (four) times daily. 03/25/15   Edahi Kroening Neva Seat, PA-C  ondansetron (ZOFRAN) 4 MG tablet Take 1 tablet (4 mg total) by mouth every 6 (six) hours. 03/25/15   Jabron Weese Neva Seat, PA-C    BP 121/63 mmHg  Pulse 71  Temp(Src) 98 F (36.7 C)  Resp 16  SpO2 91% Physical Exam  Constitutional: She appears well-developed and well-nourished. No distress.  HENT:  Head: Normocephalic and atraumatic.  Eyes: Pupils are equal, round, and reactive to light.  Neck: Normal range of motion. Neck supple.  Cardiovascular: Normal rate and regular rhythm.   Pulmonary/Chest: Effort normal.  Abdominal: Soft.  Neurological: She is alert.  Cranial nerves II-VIII and X-XII evaluated and show no deficits. Pt alert and oriented x 3 Upper and lower extremity strength is symmetrical and physiologic Normal muscular tone No facial droop Coordination intact, no limb ataxia,Rapid alternating movements normal No pronator drift  Skin: Skin is warm and dry.  Nursing note and vitals reviewed.   ED Course  Procedures (including critical care time) Labs Review Labs Reviewed  CBC WITH DIFFERENTIAL/PLATELET - Abnormal; Notable for the following:    WBC 10.9 (*)    Neutro Abs 8.1 (*)    All other components within normal limits  COMPREHENSIVE METABOLIC PANEL - Abnormal; Notable for the following:    Glucose, Bld 101 (*)    All other components within normal limits  URINALYSIS, ROUTINE W REFLEX MICROSCOPIC (NOT AT [facility]) - Abnormal; Notable for the following:    Specific Gravity, Urine 1.004 (*)    Leukocytes, UA SMALL (*)    All other components within normal limits  URINE MICROSCOPIC-ADD ON    Imaging Review Ct Head Wo Contrast  03/25/2015   CLINICAL DATA:  Vertigo, nausea and vomiting  EXAM: CT HEAD WITHOUT CONTRAST  TECHNIQUE: Contiguous axial images were obtained from the base of the skull through the vertex without intravenous contrast.  COMPARISON:  None.  FINDINGS: No acute hemorrhage, infarct, or mass lesion is identified. No midline shift. Ventricles are normal in size. Orbits and paranasal sinuses are unremarkable. No skull fracture.  IMPRESSION: Normal exam.   Electronically Signed    By: Christiana Pellant M.D.   On: 03/25/2015 11:14     EKG Interpretation   Date/Time:  Monday March 25 2015 10:23:20 EDT Ventricular Rate:  74 PR Interval:  111 QRS Duration: 92 QT Interval:  406 QTC Calculation: 450 R Axis:   46 Text Interpretation:  Sinus rhythm Borderline short PR interval Confirmed  by Jodi Mourning  MD, JOSHUA ([id number]) on 03/25/2015 11:47:13 AM      MDM   Final diagnoses:  Dizziness  Vertigo    Medications  0.9 %  sodium chloride infusion ( Intravenous New Bag/Given 03/25/15 1022)  meclizine (ANTIVERT) tablet 25 mg (25 mg Oral Given 03/25/15 1022)  ondansetron (ZOFRAN) injection 4 mg (4 mg Intravenous Given 03/25/15 1022)  meclizine (ANTIVERT) tablet 25 mg (25 mg Oral Given 03/25/15 1203)    Patient has normal head CT, CMP and CBC without any acute changes. She has small amount of leukocytes on urinalysis but she is asymptomatic of any urine complaints, will send out  culture. Normal EKG. She was given Antivert in the ED and this significantly helped he pain, I ambulated her to the bathroom and she needed no assistance from the wall or myself and had a steady gait. Endorsed some mild dizziness. Patient is follow-up with her PCP within the next few days. This patient was seen by Dr. Jodi Mourning as well, he agrees with my diagnosis, treatment and plan.   54 y.o.Diarra Sherrod's evaluation in the Emergency Department is complete. It has been determined that no acute conditions requiring further emergency intervention are present at this time. The patient/guardian have been advised of the diagnosis and plan. We have discussed signs and symptoms that warrant return to the ED, such as changes or worsening in symptoms.  Vital signs are stable at discharge. Filed Vitals:   03/25/15 1230  BP: 121/63  Pulse: 71  Temp:   Resp: 16    Patient/guardian has voiced understanding and agreed to follow-up with the PCP or specialist.     Marlon Pel, PA-C 03/25/15 1241  Blane Ohara, MD 03/25/15 [phone]

## 2015-03-27 LAB — URINE CULTURE
CULTURE: NO GROWTH
SPECIAL REQUESTS: NORMAL

## 2015-06-26 ENCOUNTER — Other Ambulatory Visit: Payer: Self-pay | Admitting: Family Medicine

## 2015-06-26 ENCOUNTER — Ambulatory Visit
Admission: RE | Admit: 2015-06-26 | Discharge: 2015-06-26 | Disposition: A | Discharge status: Home / Self Care | Payer: Managed Care, Other (non HMO) | Source: Ambulatory Visit | Attending: Family Medicine | Admitting: Family Medicine

## 2015-06-26 DIAGNOSIS — J189 Pneumonia, unspecified organism: Secondary | ICD-10-CM

## 2016-07-30 IMAGING — CR DG CHEST 2V
2 series · 2 of 2 positions shown · non-contrast
Comparison: 10/22/2013

CLINICAL DATA: Left-sided chest pain and shortness of breath for 2
days

EXAM:
CHEST  2 VIEW

[chest pa]
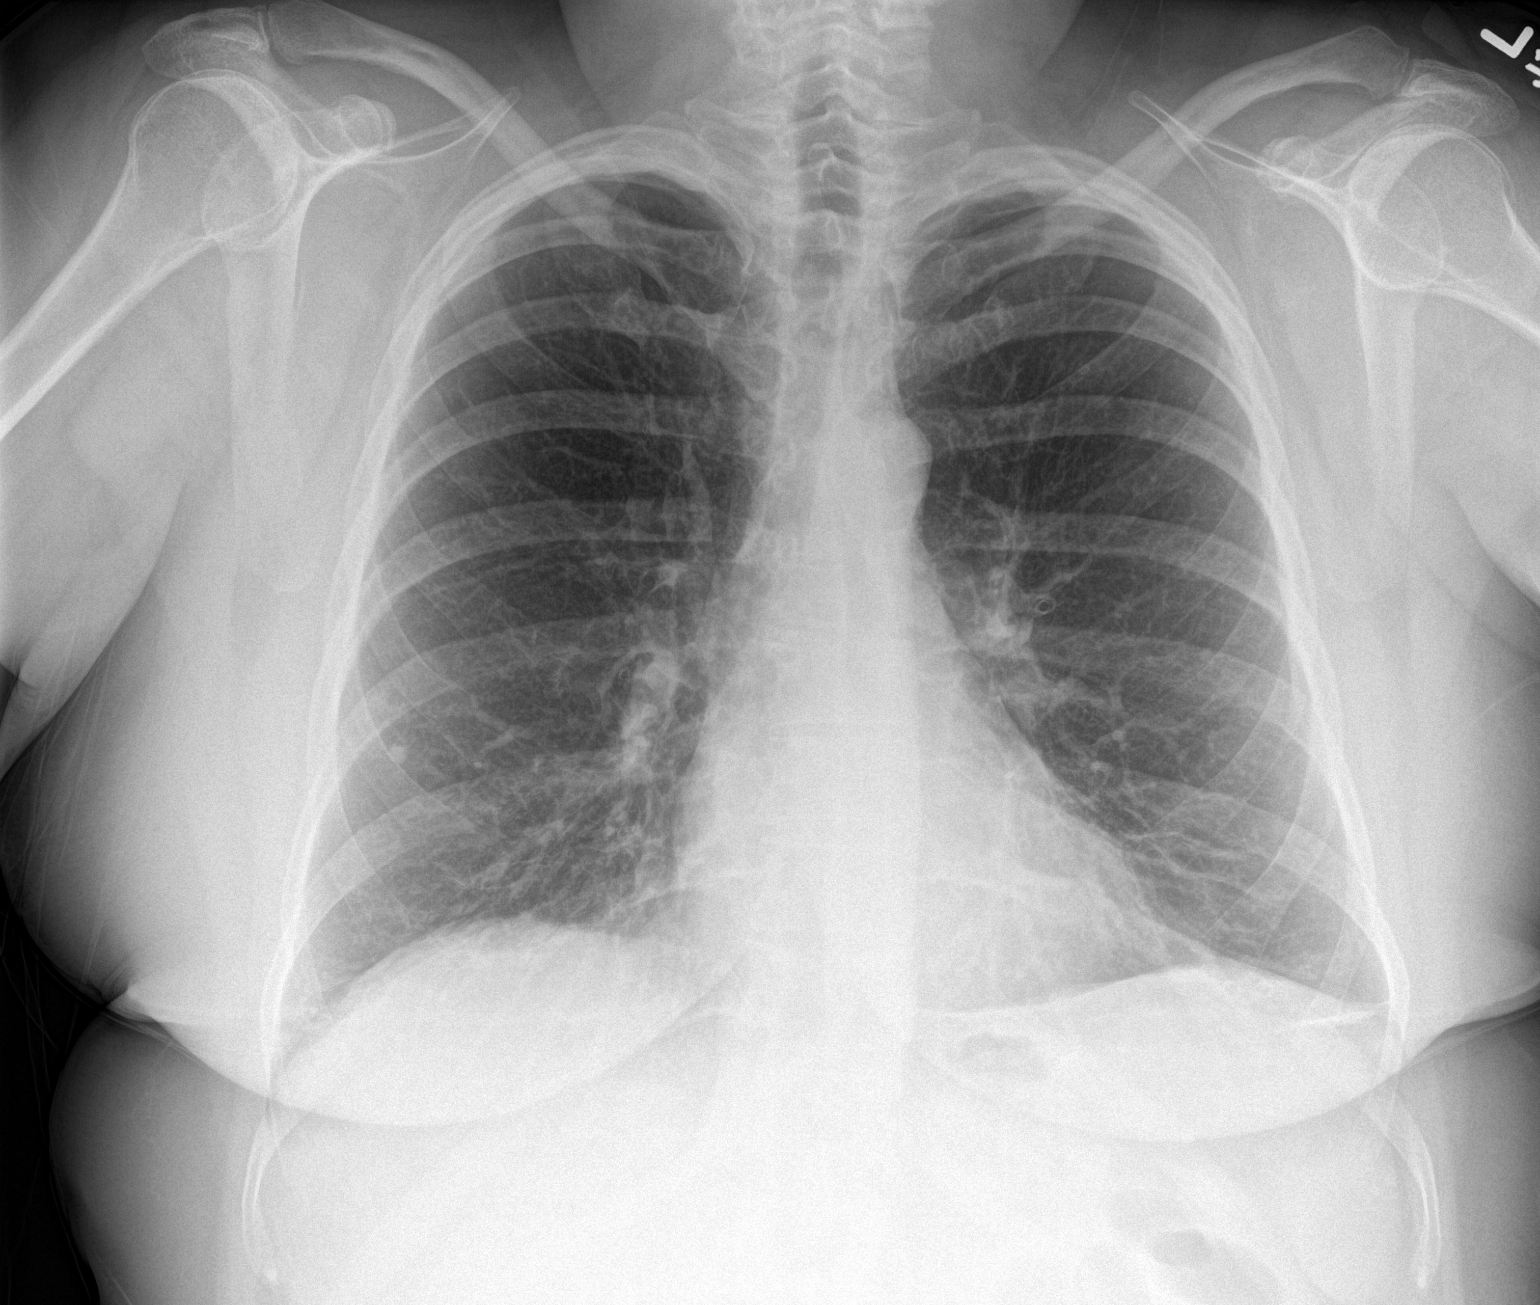

[chest lat]
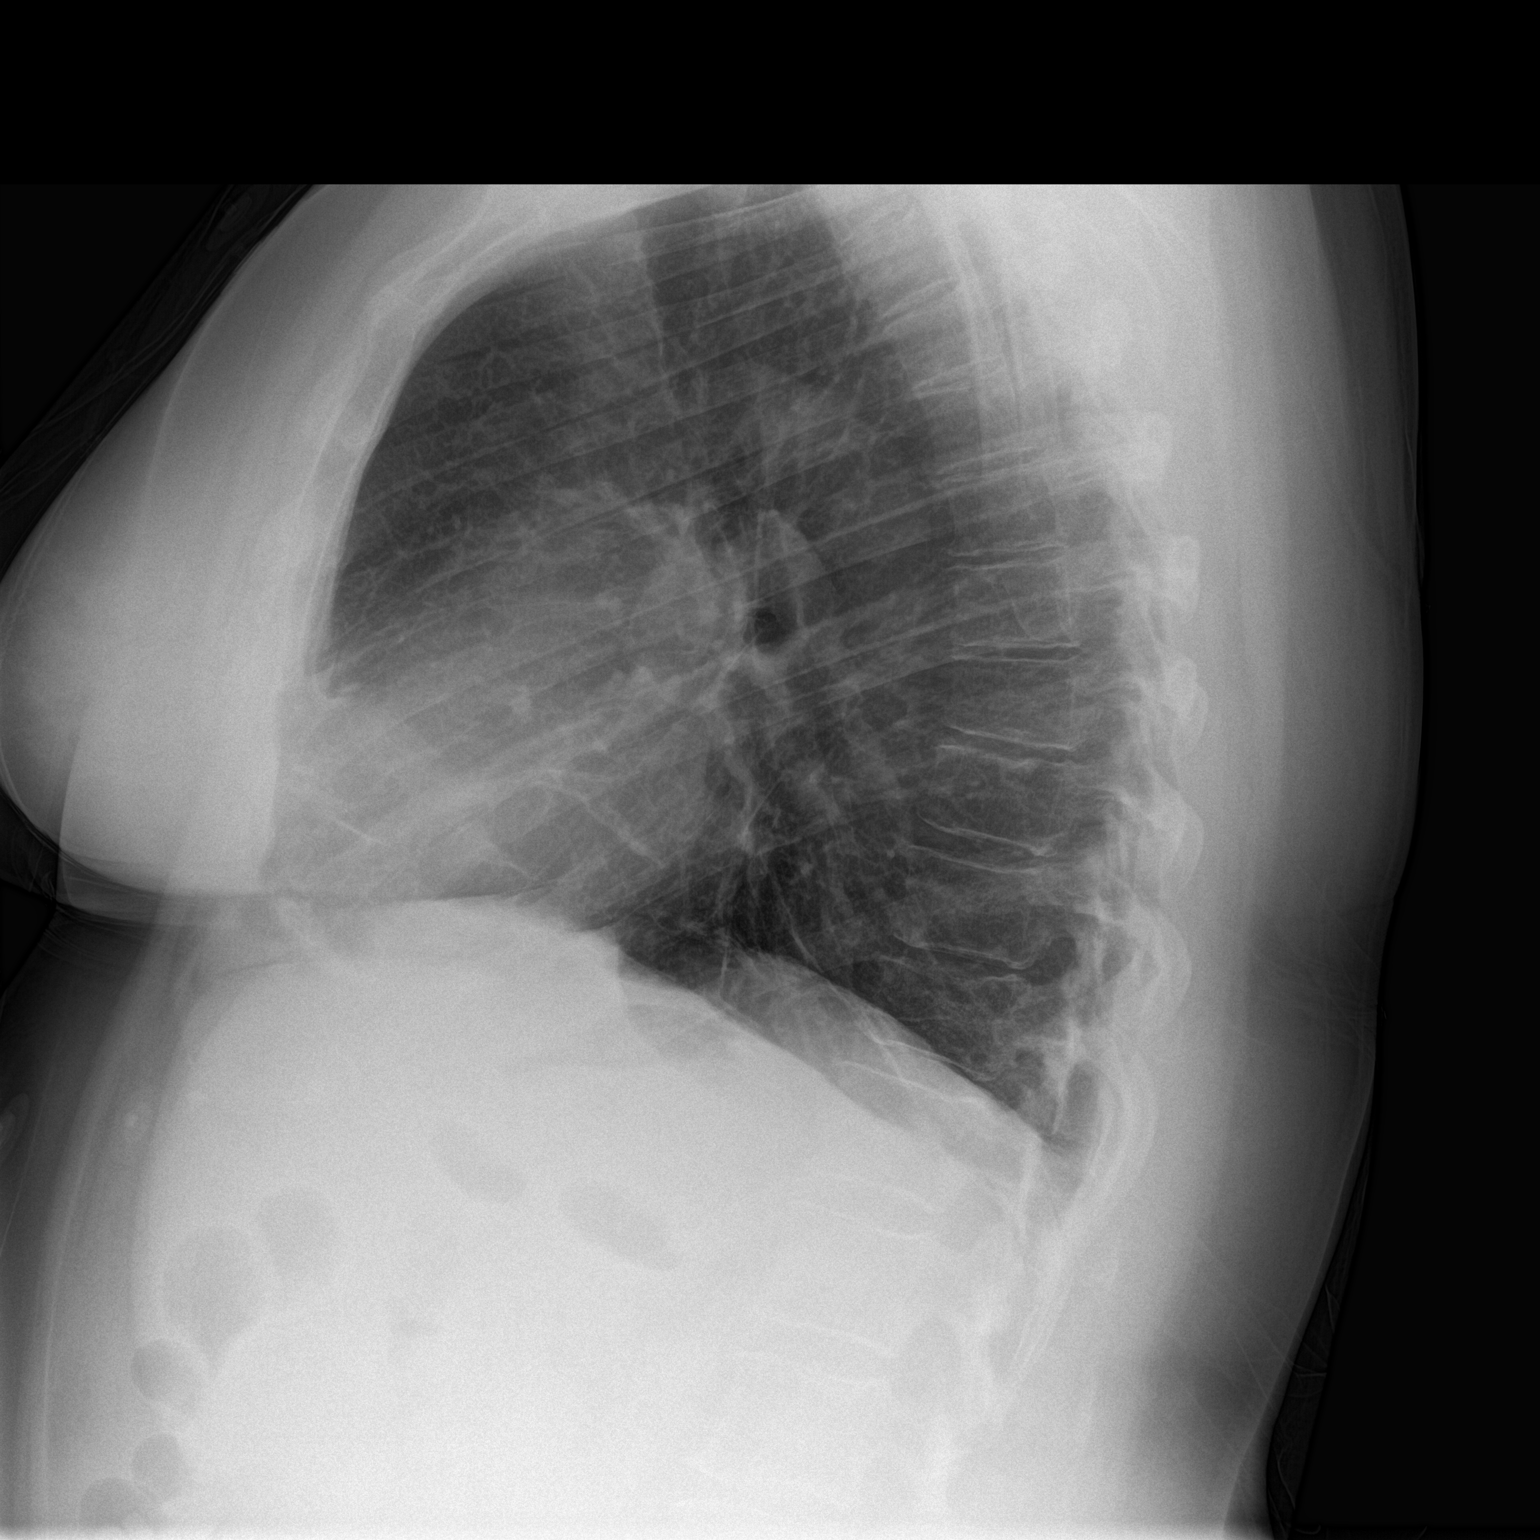

[2 of 2 positions shown; findings below may reference images not displayed]

FINDINGS: Cardiac shadow is stable. A calcified granuloma is again noted in
the right mid lung. Minimal platelike scarring is noted in the left
lung base stable from the prior exam. No new focal infiltrate or
sizable effusion is seen. No acute bony abnormality is noted.
IMPRESSION: Chronic changes without acute abnormality.

## 2017-02-17 DIAGNOSIS — M79672 Pain in left foot: Secondary | ICD-10-CM | POA: Diagnosis not present

## 2017-02-17 DIAGNOSIS — M79642 Pain in left hand: Secondary | ICD-10-CM | POA: Diagnosis not present

## 2017-02-17 DIAGNOSIS — Z79899 Other long term (current) drug therapy: Secondary | ICD-10-CM | POA: Diagnosis not present

## 2017-02-17 DIAGNOSIS — M25561 Pain in right knee: Secondary | ICD-10-CM | POA: Diagnosis not present

## 2017-02-17 DIAGNOSIS — M069 Rheumatoid arthritis, unspecified: Secondary | ICD-10-CM | POA: Diagnosis not present

## 2017-02-18 DIAGNOSIS — Z1231 Encounter for screening mammogram for malignant neoplasm of breast: Secondary | ICD-10-CM | POA: Diagnosis not present

## 2017-02-19 DIAGNOSIS — N6489 Other specified disorders of breast: Secondary | ICD-10-CM | POA: Diagnosis not present

## 2017-02-19 DIAGNOSIS — N6001 Solitary cyst of right breast: Secondary | ICD-10-CM | POA: Diagnosis not present

## 2017-03-28 DIAGNOSIS — R0981 Nasal congestion: Secondary | ICD-10-CM | POA: Diagnosis not present

## 2017-03-28 DIAGNOSIS — J9801 Acute bronchospasm: Secondary | ICD-10-CM | POA: Diagnosis not present

## 2017-04-10 DIAGNOSIS — Z719 Counseling, unspecified: Secondary | ICD-10-CM | POA: Diagnosis not present

## 2017-04-22 DIAGNOSIS — Z719 Counseling, unspecified: Secondary | ICD-10-CM | POA: Diagnosis not present

## 2017-04-29 DIAGNOSIS — Z719 Counseling, unspecified: Secondary | ICD-10-CM | POA: Diagnosis not present

## 2017-05-06 DIAGNOSIS — Z719 Counseling, unspecified: Secondary | ICD-10-CM | POA: Diagnosis not present

## 2017-05-13 DIAGNOSIS — Z719 Counseling, unspecified: Secondary | ICD-10-CM | POA: Diagnosis not present

## 2017-05-20 DIAGNOSIS — Z719 Counseling, unspecified: Secondary | ICD-10-CM | POA: Diagnosis not present

## 2017-05-27 DIAGNOSIS — Z719 Counseling, unspecified: Secondary | ICD-10-CM | POA: Diagnosis not present

## 2017-06-03 DIAGNOSIS — Z719 Counseling, unspecified: Secondary | ICD-10-CM | POA: Diagnosis not present

## 2017-06-14 DIAGNOSIS — Z79899 Other long term (current) drug therapy: Secondary | ICD-10-CM | POA: Diagnosis not present

## 2017-06-14 DIAGNOSIS — M25561 Pain in right knee: Secondary | ICD-10-CM | POA: Diagnosis not present

## 2017-06-14 DIAGNOSIS — M069 Rheumatoid arthritis, unspecified: Secondary | ICD-10-CM | POA: Diagnosis not present

## 2017-06-14 DIAGNOSIS — M7551 Bursitis of right shoulder: Secondary | ICD-10-CM | POA: Diagnosis not present

## 2017-06-15 DIAGNOSIS — Z719 Counseling, unspecified: Secondary | ICD-10-CM | POA: Diagnosis not present

## 2017-06-17 DIAGNOSIS — Z719 Counseling, unspecified: Secondary | ICD-10-CM | POA: Diagnosis not present

## 2017-06-24 DIAGNOSIS — Z719 Counseling, unspecified: Secondary | ICD-10-CM | POA: Diagnosis not present

## 2017-07-01 DIAGNOSIS — Z719 Counseling, unspecified: Secondary | ICD-10-CM | POA: Diagnosis not present

## 2017-07-06 DIAGNOSIS — M069 Rheumatoid arthritis, unspecified: Secondary | ICD-10-CM | POA: Diagnosis not present

## 2017-07-06 DIAGNOSIS — Z13220 Encounter for screening for lipoid disorders: Secondary | ICD-10-CM | POA: Diagnosis not present

## 2017-07-06 DIAGNOSIS — Z0000 Encounter for general adult medical examination without abnormal findings: Secondary | ICD-10-CM | POA: Diagnosis not present

## 2017-07-06 DIAGNOSIS — Z1159 Encounter for screening for other viral diseases: Secondary | ICD-10-CM | POA: Diagnosis not present

## 2017-07-06 DIAGNOSIS — Z23 Encounter for immunization: Secondary | ICD-10-CM | POA: Diagnosis not present

## 2017-07-08 DIAGNOSIS — Z719 Counseling, unspecified: Secondary | ICD-10-CM | POA: Diagnosis not present

## 2017-09-08 ENCOUNTER — Other Ambulatory Visit: Payer: Self-pay | Admitting: Family Medicine

## 2017-09-08 ENCOUNTER — Other Ambulatory Visit (HOSPITAL_COMMUNITY)
Admission: RE | Admit: 2017-09-08 | Discharge: 2017-09-08 | Disposition: A | Discharge status: Home / Self Care | Payer: 59 | Source: Ambulatory Visit | Attending: Family Medicine | Admitting: Family Medicine

## 2017-09-08 DIAGNOSIS — Z124 Encounter for screening for malignant neoplasm of cervix: Secondary | ICD-10-CM | POA: Diagnosis not present

## 2017-09-08 DIAGNOSIS — Z01419 Encounter for gynecological examination (general) (routine) without abnormal findings: Secondary | ICD-10-CM | POA: Insufficient documentation

## 2017-09-13 LAB — CYTOLOGY - PAP: Diagnosis: NEGATIVE

## 2017-09-22 DIAGNOSIS — M7062 Trochanteric bursitis, left hip: Secondary | ICD-10-CM | POA: Diagnosis not present

## 2017-09-22 DIAGNOSIS — M069 Rheumatoid arthritis, unspecified: Secondary | ICD-10-CM | POA: Diagnosis not present

## 2017-09-22 DIAGNOSIS — Z79899 Other long term (current) drug therapy: Secondary | ICD-10-CM | POA: Diagnosis not present

## 2017-10-25 DIAGNOSIS — Z780 Asymptomatic menopausal state: Secondary | ICD-10-CM | POA: Diagnosis not present

## 2017-10-25 DIAGNOSIS — Z13820 Encounter for screening for osteoporosis: Secondary | ICD-10-CM | POA: Diagnosis not present

## 2017-11-22 DIAGNOSIS — J069 Acute upper respiratory infection, unspecified: Secondary | ICD-10-CM | POA: Diagnosis not present

## 2017-12-21 DIAGNOSIS — M069 Rheumatoid arthritis, unspecified: Secondary | ICD-10-CM | POA: Diagnosis not present

## 2017-12-21 DIAGNOSIS — M7062 Trochanteric bursitis, left hip: Secondary | ICD-10-CM | POA: Diagnosis not present

## 2017-12-21 DIAGNOSIS — Z79899 Other long term (current) drug therapy: Secondary | ICD-10-CM | POA: Diagnosis not present

## 2018-01-07 DIAGNOSIS — M069 Rheumatoid arthritis, unspecified: Secondary | ICD-10-CM | POA: Diagnosis not present

## 2018-01-07 DIAGNOSIS — M19041 Primary osteoarthritis, right hand: Secondary | ICD-10-CM | POA: Diagnosis not present

## 2018-01-10 ENCOUNTER — Other Ambulatory Visit: Payer: Self-pay | Admitting: Family Medicine

## 2018-01-10 DIAGNOSIS — R7401 Elevation of levels of liver transaminase levels: Secondary | ICD-10-CM

## 2018-01-10 DIAGNOSIS — R740 Nonspecific elevation of levels of transaminase and lactic acid dehydrogenase [LDH]: Principal | ICD-10-CM

## 2018-01-25 ENCOUNTER — Ambulatory Visit
Admission: RE | Admit: 2018-01-25 | Discharge: 2018-01-25 | Disposition: A | Discharge status: Home / Self Care | Payer: 59 | Source: Ambulatory Visit | Attending: Family Medicine | Admitting: Family Medicine

## 2018-01-25 DIAGNOSIS — R7401 Elevation of levels of liver transaminase levels: Secondary | ICD-10-CM

## 2018-01-25 DIAGNOSIS — R7989 Other specified abnormal findings of blood chemistry: Secondary | ICD-10-CM | POA: Diagnosis not present

## 2018-01-25 DIAGNOSIS — R740 Nonspecific elevation of levels of transaminase and lactic acid dehydrogenase [LDH]: Principal | ICD-10-CM

## 2018-03-22 DIAGNOSIS — M069 Rheumatoid arthritis, unspecified: Secondary | ICD-10-CM | POA: Diagnosis not present

## 2018-03-22 DIAGNOSIS — Z79899 Other long term (current) drug therapy: Secondary | ICD-10-CM | POA: Diagnosis not present

## 2018-03-22 DIAGNOSIS — M7062 Trochanteric bursitis, left hip: Secondary | ICD-10-CM | POA: Diagnosis not present

## 2018-04-12 DIAGNOSIS — M069 Rheumatoid arthritis, unspecified: Secondary | ICD-10-CM | POA: Diagnosis not present

## 2018-04-26 DIAGNOSIS — M069 Rheumatoid arthritis, unspecified: Secondary | ICD-10-CM | POA: Diagnosis not present

## 2018-05-04 DIAGNOSIS — H524 Presbyopia: Secondary | ICD-10-CM | POA: Diagnosis not present

## 2018-05-04 DIAGNOSIS — Z79899 Other long term (current) drug therapy: Secondary | ICD-10-CM | POA: Diagnosis not present

## 2018-05-10 DIAGNOSIS — M069 Rheumatoid arthritis, unspecified: Secondary | ICD-10-CM | POA: Diagnosis not present

## 2018-05-11 DIAGNOSIS — Z79899 Other long term (current) drug therapy: Secondary | ICD-10-CM | POA: Diagnosis not present

## 2018-05-16 DIAGNOSIS — J0100 Acute maxillary sinusitis, unspecified: Secondary | ICD-10-CM | POA: Diagnosis not present

## 2018-05-24 DIAGNOSIS — M7062 Trochanteric bursitis, left hip: Secondary | ICD-10-CM | POA: Diagnosis not present

## 2018-05-24 DIAGNOSIS — M069 Rheumatoid arthritis, unspecified: Secondary | ICD-10-CM | POA: Diagnosis not present

## 2018-05-24 DIAGNOSIS — Z79899 Other long term (current) drug therapy: Secondary | ICD-10-CM | POA: Diagnosis not present

## 2018-06-07 DIAGNOSIS — M069 Rheumatoid arthritis, unspecified: Secondary | ICD-10-CM | POA: Diagnosis not present

## 2018-07-07 DIAGNOSIS — E7800 Pure hypercholesterolemia, unspecified: Secondary | ICD-10-CM | POA: Diagnosis not present

## 2018-07-07 DIAGNOSIS — M069 Rheumatoid arthritis, unspecified: Secondary | ICD-10-CM | POA: Diagnosis not present

## 2018-07-07 DIAGNOSIS — Z0000 Encounter for general adult medical examination without abnormal findings: Secondary | ICD-10-CM | POA: Diagnosis not present

## 2018-07-12 DIAGNOSIS — M069 Rheumatoid arthritis, unspecified: Secondary | ICD-10-CM | POA: Diagnosis not present

## 2018-07-25 DIAGNOSIS — Z1231 Encounter for screening mammogram for malignant neoplasm of breast: Secondary | ICD-10-CM | POA: Diagnosis not present

## 2018-08-09 DIAGNOSIS — M0609 Rheumatoid arthritis without rheumatoid factor, multiple sites: Secondary | ICD-10-CM | POA: Diagnosis not present

## 2018-09-07 DIAGNOSIS — J069 Acute upper respiratory infection, unspecified: Secondary | ICD-10-CM | POA: Diagnosis not present

## 2018-09-07 DIAGNOSIS — J029 Acute pharyngitis, unspecified: Secondary | ICD-10-CM | POA: Diagnosis not present

## 2018-09-09 DIAGNOSIS — M0609 Rheumatoid arthritis without rheumatoid factor, multiple sites: Secondary | ICD-10-CM | POA: Diagnosis not present

## 2018-09-23 DIAGNOSIS — M069 Rheumatoid arthritis, unspecified: Secondary | ICD-10-CM | POA: Diagnosis not present

## 2018-09-23 DIAGNOSIS — M7062 Trochanteric bursitis, left hip: Secondary | ICD-10-CM | POA: Diagnosis not present

## 2018-09-23 DIAGNOSIS — R21 Rash and other nonspecific skin eruption: Secondary | ICD-10-CM | POA: Diagnosis not present

## 2018-10-11 DIAGNOSIS — M069 Rheumatoid arthritis, unspecified: Secondary | ICD-10-CM | POA: Diagnosis not present

## 2018-10-11 DIAGNOSIS — M0609 Rheumatoid arthritis without rheumatoid factor, multiple sites: Secondary | ICD-10-CM | POA: Diagnosis not present

## 2018-10-19 DIAGNOSIS — R05 Cough: Secondary | ICD-10-CM | POA: Diagnosis not present

## 2018-10-19 DIAGNOSIS — H6691 Otitis media, unspecified, right ear: Secondary | ICD-10-CM | POA: Diagnosis not present

## 2018-10-19 DIAGNOSIS — K219 Gastro-esophageal reflux disease without esophagitis: Secondary | ICD-10-CM | POA: Insufficient documentation

## 2018-11-08 DIAGNOSIS — M0609 Rheumatoid arthritis without rheumatoid factor, multiple sites: Secondary | ICD-10-CM | POA: Diagnosis not present

## 2018-12-07 DIAGNOSIS — M0609 Rheumatoid arthritis without rheumatoid factor, multiple sites: Secondary | ICD-10-CM | POA: Diagnosis not present

## 2018-12-22 DIAGNOSIS — Z79899 Other long term (current) drug therapy: Secondary | ICD-10-CM | POA: Diagnosis not present

## 2018-12-22 DIAGNOSIS — M25519 Pain in unspecified shoulder: Secondary | ICD-10-CM | POA: Diagnosis not present

## 2018-12-22 DIAGNOSIS — M069 Rheumatoid arthritis, unspecified: Secondary | ICD-10-CM | POA: Diagnosis not present

## 2018-12-22 DIAGNOSIS — M797 Fibromyalgia: Secondary | ICD-10-CM | POA: Diagnosis not present

## 2019-01-05 DIAGNOSIS — E7800 Pure hypercholesterolemia, unspecified: Secondary | ICD-10-CM | POA: Diagnosis not present

## 2019-01-05 DIAGNOSIS — M069 Rheumatoid arthritis, unspecified: Secondary | ICD-10-CM | POA: Diagnosis not present

## 2019-06-03 ENCOUNTER — Emergency Department (HOSPITAL_COMMUNITY): Payer: 59

## 2019-06-03 ENCOUNTER — Other Ambulatory Visit: Payer: Self-pay

## 2019-06-03 ENCOUNTER — Emergency Department (HOSPITAL_COMMUNITY)
Admission: EM | Admit: 2019-06-03 | Discharge: 2019-06-03 | Disposition: A | Discharge status: Home / Self Care | Payer: 59 | Attending: Emergency Medicine | Admitting: Emergency Medicine

## 2019-06-03 ENCOUNTER — Encounter (HOSPITAL_COMMUNITY): Payer: Self-pay | Admitting: *Deleted

## 2019-06-03 DIAGNOSIS — S20219A Contusion of unspecified front wall of thorax, initial encounter: Secondary | ICD-10-CM

## 2019-06-03 DIAGNOSIS — S52125A Nondisplaced fracture of head of left radius, initial encounter for closed fracture: Secondary | ICD-10-CM | POA: Diagnosis not present

## 2019-06-03 DIAGNOSIS — S20214A Contusion of middle front wall of thorax, initial encounter: Secondary | ICD-10-CM | POA: Diagnosis not present

## 2019-06-03 DIAGNOSIS — Z20828 Contact with and (suspected) exposure to other viral communicable diseases: Secondary | ICD-10-CM | POA: Diagnosis not present

## 2019-06-03 DIAGNOSIS — S59902A Unspecified injury of left elbow, initial encounter: Secondary | ICD-10-CM | POA: Diagnosis present

## 2019-06-03 DIAGNOSIS — Z87891 Personal history of nicotine dependence: Secondary | ICD-10-CM | POA: Diagnosis not present

## 2019-06-03 DIAGNOSIS — Z79899 Other long term (current) drug therapy: Secondary | ICD-10-CM | POA: Diagnosis not present

## 2019-06-03 DIAGNOSIS — M25422 Effusion, left elbow: Secondary | ICD-10-CM | POA: Diagnosis not present

## 2019-06-03 DIAGNOSIS — Y999 Unspecified external cause status: Secondary | ICD-10-CM | POA: Diagnosis not present

## 2019-06-03 DIAGNOSIS — Y92410 Unspecified street and highway as the place of occurrence of the external cause: Secondary | ICD-10-CM | POA: Insufficient documentation

## 2019-06-03 DIAGNOSIS — Z20822 Contact with and (suspected) exposure to covid-19: Secondary | ICD-10-CM

## 2019-06-03 DIAGNOSIS — Y9389 Activity, other specified: Secondary | ICD-10-CM | POA: Insufficient documentation
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
## 2019-06-03 NOTE — Discharge Instructions (Signed)
Keep the splint dry and wear the sling for comfort.  Take Tylenol or ibuprofen as needed for the pain.  Elevate the arm up on a pillow when you are resting.  Your COVID test should come back tomorrow.  If it is positive you need to quarantine for 14 days.

## 2019-06-04 LAB — SARS CORONAVIRUS 2 (TAT 6-24 HRS): SARS Coronavirus 2: NEGATIVE

## 2021-01-30 DIAGNOSIS — M069 Rheumatoid arthritis, unspecified: Secondary | ICD-10-CM | POA: Diagnosis not present

## 2021-01-30 DIAGNOSIS — E7800 Pure hypercholesterolemia, unspecified: Secondary | ICD-10-CM | POA: Diagnosis not present

## 2021-01-30 DIAGNOSIS — F419 Anxiety disorder, unspecified: Secondary | ICD-10-CM | POA: Diagnosis not present

## 2021-01-30 DIAGNOSIS — M797 Fibromyalgia: Secondary | ICD-10-CM | POA: Diagnosis not present

## 2021-02-17 DIAGNOSIS — M069 Rheumatoid arthritis, unspecified: Secondary | ICD-10-CM | POA: Diagnosis not present

## 2021-03-31 DIAGNOSIS — M069 Rheumatoid arthritis, unspecified: Secondary | ICD-10-CM | POA: Diagnosis not present

## 2021-04-17 DIAGNOSIS — Z79899 Other long term (current) drug therapy: Secondary | ICD-10-CM | POA: Diagnosis not present

## 2021-04-17 DIAGNOSIS — M25539 Pain in unspecified wrist: Secondary | ICD-10-CM | POA: Diagnosis not present

## 2021-04-17 DIAGNOSIS — M797 Fibromyalgia: Secondary | ICD-10-CM | POA: Diagnosis not present

## 2021-04-17 DIAGNOSIS — M069 Rheumatoid arthritis, unspecified: Secondary | ICD-10-CM | POA: Diagnosis not present

## 2021-04-28 DIAGNOSIS — M069 Rheumatoid arthritis, unspecified: Secondary | ICD-10-CM | POA: Diagnosis not present

## 2021-06-17 DIAGNOSIS — M069 Rheumatoid arthritis, unspecified: Secondary | ICD-10-CM | POA: Diagnosis not present

## 2021-07-15 DIAGNOSIS — M069 Rheumatoid arthritis, unspecified: Secondary | ICD-10-CM | POA: Diagnosis not present

## 2021-07-29 DIAGNOSIS — J309 Allergic rhinitis, unspecified: Secondary | ICD-10-CM | POA: Diagnosis not present

## 2021-07-29 DIAGNOSIS — Z0000 Encounter for general adult medical examination without abnormal findings: Secondary | ICD-10-CM | POA: Diagnosis not present

## 2021-07-29 DIAGNOSIS — Z124 Encounter for screening for malignant neoplasm of cervix: Secondary | ICD-10-CM | POA: Diagnosis not present

## 2021-07-29 DIAGNOSIS — E7800 Pure hypercholesterolemia, unspecified: Secondary | ICD-10-CM | POA: Diagnosis not present

## 2021-07-29 DIAGNOSIS — M069 Rheumatoid arthritis, unspecified: Secondary | ICD-10-CM | POA: Diagnosis not present

## 2021-07-29 DIAGNOSIS — F419 Anxiety disorder, unspecified: Secondary | ICD-10-CM | POA: Diagnosis not present

## 2021-07-30 DIAGNOSIS — M069 Rheumatoid arthritis, unspecified: Secondary | ICD-10-CM | POA: Diagnosis not present

## 2021-08-12 DIAGNOSIS — M069 Rheumatoid arthritis, unspecified: Secondary | ICD-10-CM | POA: Diagnosis not present

## 2021-08-12 DIAGNOSIS — Z79899 Other long term (current) drug therapy: Secondary | ICD-10-CM | POA: Diagnosis not present

## 2021-08-12 DIAGNOSIS — M25522 Pain in left elbow: Secondary | ICD-10-CM | POA: Diagnosis not present

## 2021-08-12 DIAGNOSIS — M778 Other enthesopathies, not elsewhere classified: Secondary | ICD-10-CM | POA: Diagnosis not present

## 2021-08-27 DIAGNOSIS — M069 Rheumatoid arthritis, unspecified: Secondary | ICD-10-CM | POA: Diagnosis not present

## 2021-09-24 DIAGNOSIS — M069 Rheumatoid arthritis, unspecified: Secondary | ICD-10-CM | POA: Diagnosis not present

## 2021-10-22 DIAGNOSIS — M069 Rheumatoid arthritis, unspecified: Secondary | ICD-10-CM | POA: Diagnosis not present

## 2021-11-24 DIAGNOSIS — M069 Rheumatoid arthritis, unspecified: Secondary | ICD-10-CM | POA: Diagnosis not present

## 2021-12-01 DIAGNOSIS — Z79899 Other long term (current) drug therapy: Secondary | ICD-10-CM | POA: Diagnosis not present

## 2021-12-01 DIAGNOSIS — M797 Fibromyalgia: Secondary | ICD-10-CM | POA: Diagnosis not present

## 2021-12-01 DIAGNOSIS — M7702 Medial epicondylitis, left elbow: Secondary | ICD-10-CM | POA: Diagnosis not present

## 2021-12-01 DIAGNOSIS — M069 Rheumatoid arthritis, unspecified: Secondary | ICD-10-CM | POA: Diagnosis not present

## 2021-12-24 DIAGNOSIS — M069 Rheumatoid arthritis, unspecified: Secondary | ICD-10-CM | POA: Diagnosis not present

## 2022-01-21 DIAGNOSIS — M0609 Rheumatoid arthritis without rheumatoid factor, multiple sites: Secondary | ICD-10-CM | POA: Diagnosis not present

## 2022-01-28 DIAGNOSIS — M069 Rheumatoid arthritis, unspecified: Secondary | ICD-10-CM | POA: Diagnosis not present

## 2022-01-28 DIAGNOSIS — F411 Generalized anxiety disorder: Secondary | ICD-10-CM | POA: Diagnosis not present

## 2022-01-28 DIAGNOSIS — E7800 Pure hypercholesterolemia, unspecified: Secondary | ICD-10-CM | POA: Diagnosis not present

## 2022-01-28 DIAGNOSIS — J309 Allergic rhinitis, unspecified: Secondary | ICD-10-CM | POA: Diagnosis not present

## 2022-02-13 DIAGNOSIS — M069 Rheumatoid arthritis, unspecified: Secondary | ICD-10-CM | POA: Diagnosis not present

## 2022-02-13 DIAGNOSIS — Z79899 Other long term (current) drug therapy: Secondary | ICD-10-CM | POA: Diagnosis not present

## 2022-02-13 DIAGNOSIS — M1990 Unspecified osteoarthritis, unspecified site: Secondary | ICD-10-CM | POA: Diagnosis not present

## 2022-02-13 DIAGNOSIS — M797 Fibromyalgia: Secondary | ICD-10-CM | POA: Diagnosis not present

## 2022-02-25 DIAGNOSIS — M0609 Rheumatoid arthritis without rheumatoid factor, multiple sites: Secondary | ICD-10-CM | POA: Diagnosis not present

## 2022-03-12 DIAGNOSIS — J019 Acute sinusitis, unspecified: Secondary | ICD-10-CM | POA: Diagnosis not present

## 2022-04-01 DIAGNOSIS — M0609 Rheumatoid arthritis without rheumatoid factor, multiple sites: Secondary | ICD-10-CM | POA: Diagnosis not present

## 2022-04-22 DIAGNOSIS — M069 Rheumatoid arthritis, unspecified: Secondary | ICD-10-CM | POA: Diagnosis not present

## 2022-04-22 DIAGNOSIS — Z79899 Other long term (current) drug therapy: Secondary | ICD-10-CM | POA: Diagnosis not present

## 2022-04-22 DIAGNOSIS — M1990 Unspecified osteoarthritis, unspecified site: Secondary | ICD-10-CM | POA: Diagnosis not present

## 2022-04-22 DIAGNOSIS — M797 Fibromyalgia: Secondary | ICD-10-CM | POA: Diagnosis not present

## 2022-04-29 DIAGNOSIS — M0609 Rheumatoid arthritis without rheumatoid factor, multiple sites: Secondary | ICD-10-CM | POA: Diagnosis not present

## 2022-06-10 DIAGNOSIS — M0609 Rheumatoid arthritis without rheumatoid factor, multiple sites: Secondary | ICD-10-CM | POA: Diagnosis not present

## 2022-07-08 DIAGNOSIS — M0609 Rheumatoid arthritis without rheumatoid factor, multiple sites: Secondary | ICD-10-CM | POA: Diagnosis not present

## 2022-07-31 DIAGNOSIS — M069 Rheumatoid arthritis, unspecified: Secondary | ICD-10-CM | POA: Diagnosis not present

## 2022-07-31 DIAGNOSIS — E7800 Pure hypercholesterolemia, unspecified: Secondary | ICD-10-CM | POA: Diagnosis not present

## 2022-07-31 DIAGNOSIS — E668 Other obesity: Secondary | ICD-10-CM | POA: Diagnosis not present

## 2022-07-31 DIAGNOSIS — Z6832 Body mass index (BMI) 32.0-32.9, adult: Secondary | ICD-10-CM | POA: Diagnosis not present

## 2022-07-31 DIAGNOSIS — Z0000 Encounter for general adult medical examination without abnormal findings: Secondary | ICD-10-CM | POA: Diagnosis not present

## 2022-07-31 DIAGNOSIS — J309 Allergic rhinitis, unspecified: Secondary | ICD-10-CM | POA: Diagnosis not present

## 2022-07-31 DIAGNOSIS — F411 Generalized anxiety disorder: Secondary | ICD-10-CM | POA: Diagnosis not present

## 2022-07-31 DIAGNOSIS — Z23 Encounter for immunization: Secondary | ICD-10-CM | POA: Diagnosis not present

## 2022-08-03 DIAGNOSIS — H04123 Dry eye syndrome of bilateral lacrimal glands: Secondary | ICD-10-CM | POA: Diagnosis not present

## 2022-08-05 DIAGNOSIS — M0609 Rheumatoid arthritis without rheumatoid factor, multiple sites: Secondary | ICD-10-CM | POA: Diagnosis not present

## 2022-08-19 DIAGNOSIS — M1990 Unspecified osteoarthritis, unspecified site: Secondary | ICD-10-CM | POA: Diagnosis not present

## 2022-08-19 DIAGNOSIS — Z79899 Other long term (current) drug therapy: Secondary | ICD-10-CM | POA: Diagnosis not present

## 2022-08-19 DIAGNOSIS — M797 Fibromyalgia: Secondary | ICD-10-CM | POA: Diagnosis not present

## 2022-08-19 DIAGNOSIS — M069 Rheumatoid arthritis, unspecified: Secondary | ICD-10-CM | POA: Diagnosis not present

## 2022-09-16 DIAGNOSIS — M0609 Rheumatoid arthritis without rheumatoid factor, multiple sites: Secondary | ICD-10-CM | POA: Diagnosis not present

## 2022-10-14 DIAGNOSIS — M0609 Rheumatoid arthritis without rheumatoid factor, multiple sites: Secondary | ICD-10-CM | POA: Diagnosis not present

## 2022-11-18 DIAGNOSIS — M0609 Rheumatoid arthritis without rheumatoid factor, multiple sites: Secondary | ICD-10-CM | POA: Diagnosis not present

## 2022-12-16 DIAGNOSIS — M0609 Rheumatoid arthritis without rheumatoid factor, multiple sites: Secondary | ICD-10-CM | POA: Diagnosis not present

## 2022-12-31 DIAGNOSIS — M797 Fibromyalgia: Secondary | ICD-10-CM | POA: Diagnosis not present

## 2022-12-31 DIAGNOSIS — E7800 Pure hypercholesterolemia, unspecified: Secondary | ICD-10-CM | POA: Diagnosis not present

## 2022-12-31 DIAGNOSIS — M069 Rheumatoid arthritis, unspecified: Secondary | ICD-10-CM | POA: Diagnosis not present

## 2022-12-31 DIAGNOSIS — F411 Generalized anxiety disorder: Secondary | ICD-10-CM | POA: Diagnosis not present

## 2022-12-31 DIAGNOSIS — R5383 Other fatigue: Secondary | ICD-10-CM | POA: Diagnosis not present

## 2023-01-13 DIAGNOSIS — M069 Rheumatoid arthritis, unspecified: Secondary | ICD-10-CM | POA: Diagnosis not present

## 2023-01-13 DIAGNOSIS — M0609 Rheumatoid arthritis without rheumatoid factor, multiple sites: Secondary | ICD-10-CM | POA: Diagnosis not present

## 2023-02-10 DIAGNOSIS — M0609 Rheumatoid arthritis without rheumatoid factor, multiple sites: Secondary | ICD-10-CM | POA: Diagnosis not present

## 2023-02-15 DIAGNOSIS — J019 Acute sinusitis, unspecified: Secondary | ICD-10-CM | POA: Diagnosis not present

## 2023-02-24 DIAGNOSIS — M1990 Unspecified osteoarthritis, unspecified site: Secondary | ICD-10-CM | POA: Diagnosis not present

## 2023-02-24 DIAGNOSIS — Z79899 Other long term (current) drug therapy: Secondary | ICD-10-CM | POA: Diagnosis not present

## 2023-02-24 DIAGNOSIS — M79643 Pain in unspecified hand: Secondary | ICD-10-CM | POA: Diagnosis not present

## 2023-02-24 DIAGNOSIS — M069 Rheumatoid arthritis, unspecified: Secondary | ICD-10-CM | POA: Diagnosis not present

## 2023-03-18 DIAGNOSIS — M0609 Rheumatoid arthritis without rheumatoid factor, multiple sites: Secondary | ICD-10-CM | POA: Diagnosis not present

## 2023-04-21 DIAGNOSIS — M0609 Rheumatoid arthritis without rheumatoid factor, multiple sites: Secondary | ICD-10-CM | POA: Diagnosis not present

## 2023-05-19 DIAGNOSIS — M0609 Rheumatoid arthritis without rheumatoid factor, multiple sites: Secondary | ICD-10-CM | POA: Diagnosis not present

## 2023-06-02 DIAGNOSIS — M8580 Other specified disorders of bone density and structure, unspecified site: Secondary | ICD-10-CM | POA: Diagnosis not present

## 2023-06-02 DIAGNOSIS — Z79899 Other long term (current) drug therapy: Secondary | ICD-10-CM | POA: Diagnosis not present

## 2023-06-02 DIAGNOSIS — M8589 Other specified disorders of bone density and structure, multiple sites: Secondary | ICD-10-CM | POA: Diagnosis not present

## 2023-06-02 DIAGNOSIS — M79643 Pain in unspecified hand: Secondary | ICD-10-CM | POA: Diagnosis not present

## 2023-06-02 DIAGNOSIS — Z23 Encounter for immunization: Secondary | ICD-10-CM | POA: Diagnosis not present

## 2023-06-02 DIAGNOSIS — M069 Rheumatoid arthritis, unspecified: Secondary | ICD-10-CM | POA: Diagnosis not present

## 2023-06-16 DIAGNOSIS — M0609 Rheumatoid arthritis without rheumatoid factor, multiple sites: Secondary | ICD-10-CM | POA: Diagnosis not present

## 2023-06-16 DIAGNOSIS — M069 Rheumatoid arthritis, unspecified: Secondary | ICD-10-CM | POA: Diagnosis not present

## 2023-07-21 DIAGNOSIS — M0609 Rheumatoid arthritis without rheumatoid factor, multiple sites: Secondary | ICD-10-CM | POA: Diagnosis not present

## 2023-08-03 DIAGNOSIS — M0609 Rheumatoid arthritis without rheumatoid factor, multiple sites: Secondary | ICD-10-CM | POA: Diagnosis not present

## 2023-08-20 DIAGNOSIS — M0609 Rheumatoid arthritis without rheumatoid factor, multiple sites: Secondary | ICD-10-CM | POA: Diagnosis not present

## 2023-08-26 DIAGNOSIS — Z5181 Encounter for therapeutic drug level monitoring: Secondary | ICD-10-CM | POA: Diagnosis not present

## 2023-08-26 DIAGNOSIS — E7800 Pure hypercholesterolemia, unspecified: Secondary | ICD-10-CM | POA: Diagnosis not present

## 2023-08-26 DIAGNOSIS — Z6835 Body mass index (BMI) 35.0-35.9, adult: Secondary | ICD-10-CM | POA: Diagnosis not present

## 2023-08-26 DIAGNOSIS — Z23 Encounter for immunization: Secondary | ICD-10-CM | POA: Diagnosis not present

## 2023-08-26 DIAGNOSIS — F419 Anxiety disorder, unspecified: Secondary | ICD-10-CM | POA: Diagnosis not present

## 2023-08-26 DIAGNOSIS — Z0000 Encounter for general adult medical examination without abnormal findings: Secondary | ICD-10-CM | POA: Diagnosis not present

## 2023-08-30 DIAGNOSIS — M0609 Rheumatoid arthritis without rheumatoid factor, multiple sites: Secondary | ICD-10-CM | POA: Diagnosis not present

## 2023-09-20 DIAGNOSIS — M0609 Rheumatoid arthritis without rheumatoid factor, multiple sites: Secondary | ICD-10-CM | POA: Diagnosis not present

## 2023-11-02 DIAGNOSIS — J019 Acute sinusitis, unspecified: Secondary | ICD-10-CM | POA: Diagnosis not present

## 2023-11-17 DIAGNOSIS — M069 Rheumatoid arthritis, unspecified: Secondary | ICD-10-CM | POA: Diagnosis not present

## 2023-11-22 DIAGNOSIS — Z79899 Other long term (current) drug therapy: Secondary | ICD-10-CM | POA: Diagnosis not present

## 2023-11-22 DIAGNOSIS — M8580 Other specified disorders of bone density and structure, unspecified site: Secondary | ICD-10-CM | POA: Diagnosis not present

## 2023-11-22 DIAGNOSIS — M0609 Rheumatoid arthritis without rheumatoid factor, multiple sites: Secondary | ICD-10-CM | POA: Diagnosis not present

## 2023-11-22 DIAGNOSIS — M79643 Pain in unspecified hand: Secondary | ICD-10-CM | POA: Diagnosis not present

## 2023-11-22 DIAGNOSIS — M069 Rheumatoid arthritis, unspecified: Secondary | ICD-10-CM | POA: Diagnosis not present

## 2023-12-15 ENCOUNTER — Encounter (INDEPENDENT_AMBULATORY_CARE_PROVIDER_SITE_OTHER): Payer: Self-pay | Admitting: *Deleted

## 2023-12-30 DIAGNOSIS — J019 Acute sinusitis, unspecified: Secondary | ICD-10-CM | POA: Diagnosis not present

## 2024-01-04 DIAGNOSIS — J0100 Acute maxillary sinusitis, unspecified: Secondary | ICD-10-CM | POA: Diagnosis not present

## 2024-01-12 DIAGNOSIS — M069 Rheumatoid arthritis, unspecified: Secondary | ICD-10-CM | POA: Diagnosis not present

## 2024-01-17 DIAGNOSIS — M0609 Rheumatoid arthritis without rheumatoid factor, multiple sites: Secondary | ICD-10-CM | POA: Diagnosis not present

## 2024-03-13 DIAGNOSIS — M069 Rheumatoid arthritis, unspecified: Secondary | ICD-10-CM | POA: Diagnosis not present

## 2024-03-15 DIAGNOSIS — M0609 Rheumatoid arthritis without rheumatoid factor, multiple sites: Secondary | ICD-10-CM | POA: Diagnosis not present

## 2024-03-31 DIAGNOSIS — Z79899 Other long term (current) drug therapy: Secondary | ICD-10-CM | POA: Diagnosis not present

## 2024-03-31 DIAGNOSIS — M069 Rheumatoid arthritis, unspecified: Secondary | ICD-10-CM | POA: Diagnosis not present

## 2024-03-31 DIAGNOSIS — M1990 Unspecified osteoarthritis, unspecified site: Secondary | ICD-10-CM | POA: Diagnosis not present

## 2024-03-31 DIAGNOSIS — M8580 Other specified disorders of bone density and structure, unspecified site: Secondary | ICD-10-CM | POA: Diagnosis not present

## 2024-05-08 DIAGNOSIS — M069 Rheumatoid arthritis, unspecified: Secondary | ICD-10-CM | POA: Diagnosis not present

## 2024-05-08 DIAGNOSIS — M0609 Rheumatoid arthritis without rheumatoid factor, multiple sites: Secondary | ICD-10-CM | POA: Diagnosis not present

## 2024-05-09 NOTE — Progress Notes (Unsigned)
 New Patient Office Visit  Subjective   Patient ID: Caroline Leonard, female    DOB: Jan 01, 1961  Age: 63 y.o. MRN: [id number]  CC: No chief complaint on file.   HPI Jessia Kief presents to establish care ***  Outpatient Encounter Medications as of 05/10/2024  Medication Sig   albuterol  (PROVENTIL  HFA;VENTOLIN  HFA) 108 (90 BASE) MCG/ACT inhaler Inhale 1-2 puffs into the lungs every 6 (six) hours as needed for wheezing or shortness of breath (or persistent coughing). (Patient not taking: Reported on 08/06/2014)   cetirizine (ZYRTEC) 5 MG tablet Take 5 mg by mouth daily.   Cholecalciferol (VITAMIN D PO) Take 1 tablet by mouth daily.   cyclobenzaprine (FLEXERIL) 10 MG tablet Take 10 mg by mouth 3 (three) times daily as needed for muscle spasms.    dimenhyDRINATE (DRAMAMINE) 50 MG tablet Take 50 mg by mouth every 8 (eight) hours as needed for dizziness.   ibuprofen  (ADVIL ,MOTRIN ) 600 MG tablet Take 1 tablet (600 mg total) by mouth every 6 (six) hours as needed. (Patient taking differently: Take 600 mg by mouth every 6 (six) hours as needed (pain). )   ipratropium (ATROVENT ) 0.06 % nasal spray Place 2 sprays into both nostrils 4 (four) times daily. (Patient not taking: Reported on 08/06/2014)   meclizine  (ANTIVERT ) 25 MG tablet Take 1 tablet (25 mg total) by mouth 4 (four) times daily.   Omega-3 Fatty Acids (OMEGA-3 FISH OIL PO) Take 1 tablet by mouth daily.   ondansetron  (ZOFRAN ) 4 MG tablet Take 1 tablet (4 mg total) by mouth every 6 (six) hours.   vitamin E 100 UNIT capsule Take 100 Units by mouth daily.   No facility-administered encounter medications on file as of 05/10/2024.    Past Medical History:  Diagnosis Date   Vertigo     Past Surgical History:  Procedure Laterality Date   APPENDECTOMY     TONSILLECTOMY      No family history on file.  Social History   Socioeconomic History   Marital status: Single    Spouse name: Not on file   Number of children: Not on file    Years of education: Not on file   Highest education level: 12th grade  Occupational History   Not on file  Tobacco Use   Smoking status: Former   Smokeless tobacco: Never  Substance and Sexual Activity   Alcohol use: Yes    Comment: occasionally.   Drug use: No   Sexual activity: Not on file  Other Topics Concern   Not on file  Social History Narrative   Not on file   Social Drivers of Health   Financial Resource Strain: Low Risk  (05/06/2024)   Overall Financial Resource Strain (CARDIA)    Difficulty of Paying Living Expenses: Not hard at all  Food Insecurity: No Food Insecurity (05/06/2024)   Hunger Vital Sign    Worried About Running Out of Food in the Last Year: Never true    Ran Out of Food in the Last Year: Never true  Transportation Needs: No Transportation Needs (05/06/2024)   PRAPARE - Administrator, Civil Service (Medical): No    Lack of Transportation (Non-Medical): No  Physical Activity: Insufficiently Active (05/06/2024)   Exercise Vital Sign    Days of Exercise per Week: 2 days    Minutes of Exercise per Session: 30 min  Stress: Stress Concern Present (05/06/2024)   [employer] of Occupational Health - Occupational Stress Questionnaire    Feeling  of Stress: To some extent  Social Connections: Moderately Integrated (05/06/2024)   Social Connection and Isolation Panel    Frequency of Communication with Friends and Family: More than three times a week    Frequency of Social Gatherings with Friends and Family: Twice a week    Attends Religious Services: More than 4 times per year    Active Member of [employer] or Organizations: Yes    Attends Engineer, structural: More than 4 times per year    Marital Status: Never married  Intimate Partner Violence: Not on file    ROS Negative unless indicated in HPI    Objective   There were no vitals taken for this visit.  Physical Exam  Last CBC Lab Results  Component Value Date   WBC 10.9 (H)  03/25/2015   HGB 14.1 03/25/2015   HCT 42.1 03/25/2015   MCV 89.0 03/25/2015   MCH 29.8 03/25/2015   RDW 13.6 03/25/2015   PLT 283 03/25/2015   Last metabolic panel Lab Results  Component Value Date   GLUCOSE 101 (H) 03/25/2015   NA 139 03/25/2015   K 4.0 03/25/2015   CL 106 03/25/2015   CO2 25 03/25/2015   BUN 10 03/25/2015   CREATININE 0.62 03/25/2015   GFRNONAA >60 03/25/2015   CALCIUM  9.2 03/25/2015   PROT 6.6 03/25/2015   ALBUMIN 3.5 03/25/2015   BILITOT 0.6 03/25/2015   ALKPHOS 68 03/25/2015   AST 19 03/25/2015   ALT 19 03/25/2015   ANIONGAP 8 03/25/2015         Assessment & Plan:  There are no diagnoses linked to this encounter. Continue healthy lifestyle choices, including diet (rich in fruits, vegetables, and lean proteins, and low in salt and simple carbohydrates) and exercise (at least 30 minutes of moderate physical activity daily).     The above assessment and management plan was discussed with the patient. The patient verbalized understanding of and has agreed to the management plan. Patient is aware to call the clinic if they develop any new symptoms or if symptoms persist or worsen. Patient is aware when to return to the clinic for a follow-up visit. Patient educated on when it is appropriate to go to the emergency department.  No follow-ups on file.   Culver Feighner [place] Thompson, DNP [facility] [address] [place], [place] [address] [phone]  Note: This document was prepared by [employer] voice dictation technology and any errors that results from this process are unintentional.

## 2024-05-10 ENCOUNTER — Ambulatory Visit (INDEPENDENT_AMBULATORY_CARE_PROVIDER_SITE_OTHER): Payer: Self-pay | Admitting: Nurse Practitioner

## 2024-05-10 ENCOUNTER — Encounter: Payer: Self-pay | Admitting: Nurse Practitioner

## 2024-05-10 VITALS — BP 126/65 | HR 72 | Temp 97.5°F | Ht 66.0 in | Wt 209.6 lb

## 2024-05-10 DIAGNOSIS — M069 Rheumatoid arthritis, unspecified: Secondary | ICD-10-CM | POA: Insufficient documentation

## 2024-05-10 DIAGNOSIS — Z0001 Encounter for general adult medical examination with abnormal findings: Secondary | ICD-10-CM | POA: Diagnosis not present

## 2024-05-10 DIAGNOSIS — Z1231 Encounter for screening mammogram for malignant neoplasm of breast: Secondary | ICD-10-CM

## 2024-05-10 DIAGNOSIS — M797 Fibromyalgia: Secondary | ICD-10-CM | POA: Insufficient documentation

## 2024-05-10 DIAGNOSIS — F419 Anxiety disorder, unspecified: Secondary | ICD-10-CM | POA: Diagnosis not present

## 2024-05-10 DIAGNOSIS — E66811 Obesity, class 1: Secondary | ICD-10-CM

## 2024-05-10 DIAGNOSIS — K219 Gastro-esophageal reflux disease without esophagitis: Secondary | ICD-10-CM

## 2024-05-10 DIAGNOSIS — Z1211 Encounter for screening for malignant neoplasm of colon: Secondary | ICD-10-CM | POA: Insufficient documentation

## 2024-05-10 DIAGNOSIS — F5105 Insomnia due to other mental disorder: Secondary | ICD-10-CM

## 2024-05-10 DIAGNOSIS — Z6833 Body mass index (BMI) 33.0-33.9, adult: Secondary | ICD-10-CM

## 2024-05-10 DIAGNOSIS — M06042 Rheumatoid arthritis without rheumatoid factor, left hand: Secondary | ICD-10-CM

## 2024-05-10 DIAGNOSIS — F99 Mental disorder, not otherwise specified: Secondary | ICD-10-CM

## 2024-05-10 DIAGNOSIS — M06041 Rheumatoid arthritis without rheumatoid factor, right hand: Secondary | ICD-10-CM

## 2024-05-10 MED ORDER — ESCITALOPRAM OXALATE 20 MG PO TABS: 20.0000 mg | ORAL_TABLET | Freq: Every day | ORAL | 1 refills | Status: AC

## 2024-05-10 MED ORDER — TRAZODONE HCL 50 MG PO TABS: 50.0000 mg | ORAL_TABLET | Freq: Every day | ORAL | 1 refills | Status: AC

## 2024-05-11 ENCOUNTER — Ambulatory Visit: Payer: Self-pay | Admitting: Nurse Practitioner

## 2024-05-11 DIAGNOSIS — E782 Mixed hyperlipidemia: Secondary | ICD-10-CM | POA: Insufficient documentation

## 2024-05-11 LAB — LIPID PANEL
Chol/HDL Ratio: 3.7 ratio (ref 0.0–4.4)
Cholesterol, Total: 225 mg/dL — ABNORMAL HIGH (ref 100–199)
HDL: 61 mg/dL (ref >39)
LDL Chol Calc (NIH): 128 mg/dL — ABNORMAL HIGH (ref 0–99)
Triglycerides: 207 mg/dL — ABNORMAL HIGH (ref 0–149)
VLDL Cholesterol Cal: 36 mg/dL (ref 5–40)

## 2024-05-11 LAB — CBC WITH DIFFERENTIAL/PLATELET
Basophils Absolute: 0 x10E3/uL (ref 0.0–0.2)
Basos: 0 %
EOS (ABSOLUTE): 0.1 x10E3/uL (ref 0.0–0.4)
Eos: 2 %
Hematocrit: 45.7 % (ref 34.0–46.6)
Hemoglobin: 15.1 g/dL (ref 11.1–15.9)
Immature Grans (Abs): 0 x10E3/uL (ref 0.0–0.1)
Immature Granulocytes: 0 %
Lymphocytes Absolute: 2.8 x10E3/uL (ref 0.7–3.1)
Lymphs: 40 %
MCH: 29.8 pg (ref 26.6–33.0)
MCHC: 33 g/dL (ref 31.5–35.7)
MCV: 90 fL (ref 79–97)
Monocytes Absolute: 0.6 x10E3/uL (ref 0.1–0.9)
Monocytes: 9 %
Neutrophils Absolute: 3.5 x10E3/uL (ref 1.4–7.0)
Neutrophils: 49 %
Platelets: 263 x10E3/uL (ref 150–450)
RBC: 5.06 x10E6/uL (ref 3.77–5.28)
RDW: 12.6 % (ref 11.7–15.4)
WBC: 7.1 x10E3/uL (ref 3.4–10.8)

## 2024-05-11 LAB — CMP14+EGFR
ALT: 34 IU/L — ABNORMAL HIGH (ref 0–32)
AST: 36 IU/L (ref 0–40)
Albumin: 4.1 g/dL (ref 3.9–4.9)
Alkaline Phosphatase: 84 IU/L (ref 44–121)
BUN/Creatinine Ratio: 20 (ref 12–28)
BUN: 15 mg/dL (ref 8–27)
Bilirubin Total: 0.3 mg/dL (ref 0.0–1.2)
CO2: 24 mmol/L (ref 20–29)
Calcium: 9.9 mg/dL (ref 8.7–10.3)
Chloride: 102 mmol/L (ref 96–106)
Creatinine, Ser: 0.76 mg/dL (ref 0.57–1.00)
Globulin, Total: 3.2 g/dL (ref 1.5–4.5)
Glucose: 79 mg/dL (ref 70–99)
Potassium: 4.6 mmol/L (ref 3.5–5.2)
Sodium: 141 mmol/L (ref 134–144)
Total Protein: 7.3 g/dL (ref 6.0–8.5)
eGFR: 87 mL/min/1.73 (ref >59)

## 2024-05-11 LAB — THYROID PANEL WITH TSH
Free Thyroxine Index: 1.9 (ref 1.2–4.9)
T3 Uptake Ratio: 28 % (ref 24–39)
T4, Total: 6.7 ug/dL (ref 4.5–12.0)
TSH: 1.83 u[IU]/mL (ref 0.450–4.500)

## 2024-05-11 MED ORDER — ATORVASTATIN CALCIUM 20 MG PO TABS: 20.0000 mg | ORAL_TABLET | Freq: Every day | ORAL | 0 refills | Status: AC

## 2024-05-17 DIAGNOSIS — M0609 Rheumatoid arthritis without rheumatoid factor, multiple sites: Secondary | ICD-10-CM | POA: Diagnosis not present

## 2024-05-31 DIAGNOSIS — L089 Local infection of the skin and subcutaneous tissue, unspecified: Secondary | ICD-10-CM | POA: Diagnosis not present

## 2024-06-15 ENCOUNTER — Encounter (INDEPENDENT_AMBULATORY_CARE_PROVIDER_SITE_OTHER): Payer: Self-pay | Admitting: *Deleted

## 2024-06-30 DIAGNOSIS — J069 Acute upper respiratory infection, unspecified: Secondary | ICD-10-CM | POA: Diagnosis not present

## 2024-07-05 ENCOUNTER — Other Ambulatory Visit: Payer: Self-pay | Admitting: Medical Genetics

## 2024-07-05 ENCOUNTER — Encounter (INDEPENDENT_AMBULATORY_CARE_PROVIDER_SITE_OTHER): Payer: Self-pay | Admitting: *Deleted

## 2024-07-05 DIAGNOSIS — M0609 Rheumatoid arthritis without rheumatoid factor, multiple sites: Secondary | ICD-10-CM | POA: Diagnosis not present

## 2024-07-10 ENCOUNTER — Ambulatory Visit
Admission: RE | Admit: 2024-07-10 | Discharge: 2024-07-10 | Disposition: A | Discharge status: Home / Self Care | Source: Ambulatory Visit | Attending: Nurse Practitioner | Admitting: Nurse Practitioner

## 2024-07-12 DIAGNOSIS — M0609 Rheumatoid arthritis without rheumatoid factor, multiple sites: Secondary | ICD-10-CM | POA: Diagnosis not present

## 2024-07-17 ENCOUNTER — Other Ambulatory Visit (HOSPITAL_COMMUNITY)

## 2024-07-19 ENCOUNTER — Telehealth: Payer: Self-pay

## 2024-07-19 ENCOUNTER — Other Ambulatory Visit (HOSPITAL_COMMUNITY)
Admission: RE | Admit: 2024-07-19 | Discharge: 2024-07-19 | Disposition: A | Discharge status: Home / Self Care | Payer: Self-pay | Source: Ambulatory Visit | Attending: Oncology | Admitting: Oncology

## 2024-07-19 MED ORDER — PEG 3350-KCL-NA BICARB-NACL 420 G PO SOLR: 4000.0000 mL | Freq: Once | ORAL | 0 refills | Status: AC

## 2024-07-19 NOTE — Telephone Encounter (Addendum)
 Spoke with pt. Scheduled for 12/9. Aware will send instructions via mychart and will mail. [facility]. Aware she will get a pre-op phone call few days prior to procedure with arrival time.    PA approved via carelon Order ID: [id number]       Authorized Approval Valid Through: 07/19/2024 - 10/16/2024

## 2024-07-19 NOTE — Addendum Note (Signed)
 Addended by: JEANELL GRAEME RAMAN on: 07/19/2024 09:57 AM   Modules accepted: Orders

## 2024-07-19 NOTE — Telephone Encounter (Signed)
 Any room Thanks

## 2024-07-19 NOTE — Telephone Encounter (Signed)
 Who is your primary care physician: Nena Hummer  Reasons for the colonoscopy: hx polyps  Have you had a colonoscopy before?  Yes 2014  Do you have family history of colon cancer? no  Previous colonoscopy with polyps removed? no  Do you have a history colorectal cancer?   no  Are you diabetic? If yes, Type 1 or Type 2?    no  Do you have a prosthetic or mechanical heart valve? no  Do you have a pacemaker/defibrillator?   no  Have you had endocarditis/atrial fibrillation? no  Have you had joint replacement within the last 12 months?  no  Do you tend to be constipated or have to use laxatives? no  Do you have any history of drugs or alchohol?  no  Do you use supplemental oxygen?  no  Have you had a stroke or heart attack within the last 6 months? no  Do you take weight loss medication?  no  For female patients: have you had a hysterectomy?  no                                     are you post menopausal?       yes                                            do you still have your menstrual cycle? no      Do you take any blood-thinning medications such as: (aspirin, warfarin, Plavix, Aggrenox)  no  If yes we need the name, milligram, dosage and who is prescribing doctor  Current Outpatient Medications on File Prior to Visit  Medication Sig Dispense Refill   atorvastatin  (LIPITOR) 20 MG tablet Take 1 tablet (20 mg total) by mouth daily. 90 tablet 0   escitalopram  (LEXAPRO ) 20 MG tablet Take 1 tablet (20 mg total) by mouth daily. 90 tablet 1   gabapentin (NEURONTIN) 100 MG capsule Take 100 mg by mouth 3 (three) times daily.     ipratropium (ATROVENT ) 0.03 % nasal spray Place 2 sprays into both nostrils 3 (three) times daily.     traZODone  (DESYREL ) 50 MG tablet Take 1 tablet (50 mg total) by mouth at bedtime. 90 tablet 1   No current facility-administered medications on file prior to visit.    Allergies  Allergen Reactions   Neosporin [Neomycin-Polymyxin-Gramicidin]  Other (See Comments)    blisters     [facility]  Primary Insurance Name: CHARON [id number]  Best number where you can be reached: [phone]

## 2024-07-20 ENCOUNTER — Encounter (INDEPENDENT_AMBULATORY_CARE_PROVIDER_SITE_OTHER): Payer: Self-pay | Admitting: *Deleted

## 2024-07-20 NOTE — Telephone Encounter (Signed)
 Referral completed, [facility] apt letter sent to PCP

## 2024-07-31 LAB — GENECONNECT MOLECULAR SCREEN: Genetic Analysis Overall Interpretation: NEGATIVE

## 2024-08-03 ENCOUNTER — Other Ambulatory Visit: Payer: Self-pay

## 2024-08-03 ENCOUNTER — Encounter (HOSPITAL_COMMUNITY): Payer: Self-pay

## 2024-08-03 ENCOUNTER — Encounter (HOSPITAL_COMMUNITY)
Admission: RE | Admit: 2024-08-03 | Discharge: 2024-08-03 | Disposition: A | Source: Ambulatory Visit | Attending: Gastroenterology
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
## 2024-08-18 ENCOUNTER — Encounter (HOSPITAL_COMMUNITY): Payer: Self-pay

## 2024-08-18 ENCOUNTER — Encounter (HOSPITAL_COMMUNITY)
Admission: RE | Admit: 2024-08-18 | Discharge: 2024-08-18 | Disposition: A | Discharge status: Home / Self Care | Source: Ambulatory Visit | Attending: Gastroenterology | Admitting: Gastroenterology

## 2024-08-22 ENCOUNTER — Ambulatory Visit (HOSPITAL_COMMUNITY)
Admission: RE | Admit: 2024-08-22 | Discharge: 2024-08-22 | Disposition: A | Discharge status: Home / Self Care | Attending: Gastroenterology | Admitting: Gastroenterology

## 2024-08-22 ENCOUNTER — Encounter (HOSPITAL_COMMUNITY): Payer: Self-pay | Admitting: Gastroenterology

## 2024-08-22 ENCOUNTER — Ambulatory Visit (HOSPITAL_COMMUNITY): Payer: Self-pay | Admitting: Anesthesiology

## 2024-08-22 ENCOUNTER — Encounter (HOSPITAL_COMMUNITY): Payer: Self-pay | Admitting: Anesthesiology

## 2024-08-22 ENCOUNTER — Encounter (INDEPENDENT_AMBULATORY_CARE_PROVIDER_SITE_OTHER): Payer: Self-pay | Admitting: *Deleted

## 2024-08-22 ENCOUNTER — Encounter (HOSPITAL_COMMUNITY): Admission: RE | Disposition: A | Payer: Self-pay | Source: Home / Self Care | Attending: Gastroenterology

## 2024-08-22 DIAGNOSIS — F419 Anxiety disorder, unspecified: Secondary | ICD-10-CM | POA: Insufficient documentation

## 2024-08-22 DIAGNOSIS — I10 Essential (primary) hypertension: Secondary | ICD-10-CM | POA: Diagnosis not present

## 2024-08-22 DIAGNOSIS — K635 Polyp of colon: Secondary | ICD-10-CM

## 2024-08-22 DIAGNOSIS — Z860100 Personal history of colon polyps, unspecified: Secondary | ICD-10-CM

## 2024-08-22 DIAGNOSIS — M1990 Unspecified osteoarthritis, unspecified site: Secondary | ICD-10-CM | POA: Insufficient documentation

## 2024-08-22 DIAGNOSIS — D122 Benign neoplasm of ascending colon: Secondary | ICD-10-CM | POA: Diagnosis not present

## 2024-08-22 DIAGNOSIS — Z87891 Personal history of nicotine dependence: Secondary | ICD-10-CM | POA: Insufficient documentation

## 2024-08-22 DIAGNOSIS — Z1211 Encounter for screening for malignant neoplasm of colon: Secondary | ICD-10-CM | POA: Diagnosis not present

## 2024-08-22 DIAGNOSIS — K5730 Diverticulosis of large intestine without perforation or abscess without bleeding: Secondary | ICD-10-CM | POA: Insufficient documentation

## 2024-08-22 HISTORY — PX: POLYPECTOMY: SHX149

## 2024-08-22 HISTORY — PX: COLONOSCOPY: SHX5424

## 2024-08-22 LAB — HM COLONOSCOPY

## 2024-08-22 SURGERY — COLONOSCOPY
Anesthesia: Monitor Anesthesia Care

## 2024-08-22 MED ORDER — LACTATED RINGERS IV SOLN: INTRAVENOUS | Status: DC

## 2024-08-22 MED ORDER — LIDOCAINE 2% (20 MG/ML) 5 ML SYRINGE
INTRAMUSCULAR | Status: DC | PRN
  Administered 2024-08-22: 100 mg via INTRAVENOUS

## 2024-08-22 MED ORDER — PROPOFOL 10 MG/ML IV BOLUS
INTRAVENOUS | Status: DC | PRN
  Administered 2024-08-22: 100 mg via INTRAVENOUS

## 2024-08-22 MED ORDER — PROPOFOL 500 MG/50ML IV EMUL
INTRAVENOUS | Status: DC | PRN
  Administered 2024-08-22: 150 ug/kg/min via INTRAVENOUS

## 2024-08-22 MED ORDER — PHENYLEPHRINE 80 MCG/ML (10ML) SYRINGE FOR IV PUSH (FOR BLOOD PRESSURE SUPPORT)
PREFILLED_SYRINGE | INTRAVENOUS | Status: DC | PRN
  Administered 2024-08-22: 80 ug via INTRAVENOUS
  Administered 2024-08-22: 160 ug via INTRAVENOUS

## 2024-08-22 NOTE — Discharge Instructions (Signed)
 You are being discharged to home.  Resume your previous diet.  We are waiting for your pathology results.  Your physician has recommended a repeat colonoscopy for surveillance based on pathology results.

## 2024-08-22 NOTE — H&P (Signed)
 Caroline Leonard is an 63 y.o. female.   Chief Complaint: history of colon polyps HPI: 63 y/o F with no PMH GERD, anxiety, arthritis, coming for screening colonoscopy.  States the last colonoscopy was performed 10 years ago and she had some polyps (no report available).  The patient denies having any complaints such as melena, hematochezia, abdominal pain or distention, change in her bowel movement consistency or frequency, no changes in weight recently.  No family history of colorectal cancer.     Past Medical History:  Diagnosis Date   Allergy    Anxiety    On and off associated with my rheumatoid arthritis. And sometimes  stresses at work   Arthritis    GERD (gastroesophageal reflux disease)    Vertigo     Past Surgical History:  Procedure Laterality Date   APPENDECTOMY     TONSILLECTOMY      Family History  Problem Relation Age of Onset   COPD Mother    Alcohol abuse Father    Social History:  reports that she has quit smoking. Her smoking use included cigarettes. She has a 30 pack-year smoking history. She has never used smokeless tobacco. She reports that she does not currently use alcohol after a past usage of about 2.0 standard drinks of alcohol per week. She reports that she does not use drugs.  Allergies: Allergies[1]  Medications Prior to Admission  Medication Sig Dispense Refill   atorvastatin  (LIPITOR) 20 MG tablet Take 1 tablet (20 mg total) by mouth daily. 90 tablet 0   escitalopram  (LEXAPRO ) 20 MG tablet Take 1 tablet (20 mg total) by mouth daily. 90 tablet 1   gabapentin (NEURONTIN) 100 MG capsule Take 100 mg by mouth 3 (three) times daily.     ipratropium (ATROVENT ) 0.03 % nasal spray Place 2 sprays into both nostrils 3 (three) times daily.     traZODone  (DESYREL ) 50 MG tablet Take 1 tablet (50 mg total) by mouth at bedtime. 90 tablet 1    No results found for this or any previous visit (from the past 48 hours). No results found.  Review of Systems  All  other systems reviewed and are negative.   Blood pressure (!) 153/79, temperature 97.8 F (36.6 C), resp. rate 14, SpO2 97%. Physical Exam  GENERAL: The patient is AO x3, in no acute distress. HEENT: Head is normocephalic and atraumatic. EOMI are intact. Mouth is well hydrated and without lesions. NECK: Supple. No masses LUNGS: Clear to auscultation. No presence of rhonchi/wheezing/rales. Adequate chest expansion HEART: RRR, normal s1 and s2. ABDOMEN: Soft, nontender, no guarding, no peritoneal signs, and nondistended. BS +. No masses. EXTREMITIES: Without any cyanosis, clubbing, rash, lesions or edema. NEUROLOGIC: AOx3, no focal motor deficit. SKIN: no jaundice, no rashes  Assessment/Plan 63 y/o F with no PMH GERD, anxiety, arthritis, coming for screening colonoscopy.  We will proceed with colonoscopy  Caroline Eartha Flavors, MD 08/22/2024, 8:19 AM       [1]  Allergies Allergen Reactions   Neosporin [Neomycin-Polymyxin-Gramicidin] Other (See Comments)    blisters

## 2024-08-22 NOTE — Anesthesia Preprocedure Evaluation (Signed)
 Anesthesia Evaluation  Patient identified by MRN, date of birth, ID band Patient awake    Reviewed: Allergy & Precautions, H&P , NPO status , Patient's Chart, lab work & pertinent test results, reviewed documented beta blocker date and time   Airway Mallampati: II  TM Distance: >3 FB Neck ROM: full    Dental no notable dental hx.    Pulmonary neg pulmonary ROS, former smoker   Pulmonary exam normal breath sounds clear to auscultation       Cardiovascular Exercise Tolerance: Good hypertension, negative cardio ROS  Rhythm:regular Rate:Normal     Neuro/Psych negative neurological ROS  negative psych ROS   GI/Hepatic negative GI ROS, Neg liver ROS,,,  Endo/Other  negative endocrine ROS    Renal/GU negative Renal ROS  negative genitourinary   Musculoskeletal   Abdominal   Peds  Hematology negative hematology ROS (+)   Anesthesia Other Findings   Reproductive/Obstetrics negative OB ROS                              Anesthesia Physical Anesthesia Plan  ASA: 2  Anesthesia Plan: MAC   Post-op Pain Management:    Induction:   PONV Risk Score and Plan: Propofol  infusion  Airway Management Planned:   Additional Equipment:   Intra-op Plan:   Post-operative Plan:   Informed Consent: I have reviewed the patients History and Physical, chart, labs and discussed the procedure including the risks, benefits and alternatives for the proposed anesthesia with the patient or authorized representative who has indicated his/her understanding and acceptance.     Dental Advisory Given  Plan Discussed with: CRNA  Anesthesia Plan Comments:         Anesthesia Quick Evaluation

## 2024-08-22 NOTE — Transfer of Care (Signed)
 Immediate Anesthesia Transfer of Care Note  Patient: Caroline Leonard  Procedure(s) Performed: COLONOSCOPY POLYPECTOMY, INTESTINE  Patient Location: Short Stay  Anesthesia Type:MAC  Level of Consciousness: awake, alert , oriented, and patient cooperative  Airway & Oxygen Therapy: Patient Spontanous Breathing  Post-op Assessment: Report given to RN, Post -op Vital signs reviewed and stable, and Patient moving all extremities X 4  Post vital signs: Reviewed and stable  Last Vitals:  Vitals Value Taken Time  BP 93/79 08/22/24 09:44  Temp 36.7 C 08/22/24 09:44  Pulse 67 08/22/24 09:44  Resp 22 08/22/24 09:44  SpO2 97 0944    Last Pain:  Vitals:   08/22/24 0944  TempSrc: Oral  PainSc: 0-No pain         Complications: No notable events documented.

## 2024-08-22 NOTE — Op Note (Signed)
 [facility] Patient Name: Caroline Leonard Procedure Date: 08/22/2024 8:57 AM MRN: [id number] Date of Birth: 09/24/1960 Attending MD: Toribio Fortune , , [id number] CSN: [id number] Age: 63 Admit Type: Outpatient Procedure:                Colonoscopy Indications:              High risk colon cancer surveillance: Personal                            history of colonic polyps Providers:                Toribio Fortune, Madelin Hunter, RN, Daphne Mulch                            Technician, Technician Referring MD:             Toribio Fortune Medicines:                Monitored Anesthesia Care Complications:            No immediate complications. Estimated Blood Loss:     Estimated blood loss: none. Procedure:                Pre-Anesthesia Assessment:                           - Prior to the procedure, a History and Physical                            was performed, and patient medications, allergies                            and sensitivities were reviewed. The patient's                            tolerance of previous anesthesia was reviewed.                           - The risks and benefits of the procedure and the                            sedation options and risks were discussed with the                            patient. All questions were answered and informed                            consent was obtained.                           - ASA Grade Assessment: II - A patient with mild                            systemic disease.                           After obtaining informed consent, the colonoscope  was passed under direct vision. Throughout the                            procedure, the patient's blood pressure, pulse, and                            oxygen saturations were monitored continuously. The                            [id number]-[id number] ([id number]) Peds Colon was introduced                            through the anus and advanced to the the cecum,                             identified by appendiceal orifice and ileocecal                            valve. The colonoscopy was performed without                            difficulty. The patient tolerated the procedure                            well. The quality of the bowel preparation was good. Scope In: 9:20:29 AM Scope Out: 9:38:37 AM Scope Withdrawal Time: 0 hours 14 minutes 28 seconds  Total Procedure Duration: 0 hours 18 minutes 8 seconds  Findings:      The perianal and digital rectal examinations were normal.      Two sessile polyps were found in the ascending colon. The polyps were 1       to 2 mm in size. These polyps were removed with a cold snare. Resection       and retrieval were complete.      A few small-mouthed diverticula were found in the sigmoid colon.      The retroflexed view of the distal rectum and anal verge was normal and       showed no anal or rectal abnormalities. Impression:               - Two 1 to 2 mm polyps in the ascending colon,                            removed with a cold snare. Resected and retrieved.                           - Diverticulosis in the sigmoid colon.                           - The distal rectum and anal verge are normal on                            retroflexion view. Moderate Sedation:      Per Anesthesia Care Recommendation:           - Discharge patient to home (ambulatory).                           -  Resume previous diet.                           - Await pathology results.                           - Repeat colonoscopy for surveillance based on                            pathology results. Procedure Code(s):        --- Professional ---                           [phone], Colonoscopy, flexible; with removal of                            tumor(s), polyp(s), or other lesion(s) by snare                            technique Diagnosis Code(s):        --- Professional ---                           Z86.010, Personal history of  colonic polyps                           D12.2, Benign neoplasm of ascending colon                           K57.30, Diverticulosis of large intestine without                            perforation or abscess without bleeding CPT copyright 2022 [employer]. All rights reserved. The codes documented in this report are preliminary and upon coder review may  be revised to meet current compliance requirements. Toribio Fortune, MD Toribio Fortune,  08/22/2024 9:45:16 AM This report has been signed electronically. Number of Addenda: 0

## 2024-08-23 LAB — SURGICAL PATHOLOGY

## 2024-08-25 ENCOUNTER — Ambulatory Visit (INDEPENDENT_AMBULATORY_CARE_PROVIDER_SITE_OTHER): Payer: Self-pay | Admitting: Gastroenterology

## 2024-08-25 NOTE — Anesthesia Postprocedure Evaluation (Signed)
"   Anesthesia Post Note  Patient: Caroline Leonard  Procedure(s) Performed: COLONOSCOPY POLYPECTOMY, INTESTINE  Patient location during evaluation: Phase II Anesthesia Type: MAC Level of consciousness: awake Pain management: pain level controlled Vital Signs Assessment: post-procedure vital signs reviewed and stable Respiratory status: spontaneous breathing and respiratory function stable Cardiovascular status: blood pressure returned to baseline and stable Postop Assessment: no headache and no apparent nausea or vomiting Anesthetic complications: no Comments: Late entry   No notable events documented.   Last Vitals:  Vitals:   08/22/24 0805 08/22/24 0944  BP: (!) 153/79 93/79  Pulse:  67  Resp: 14 (!) 22  Temp: 36.6 C 36.7 C  SpO2: 97%     Last Pain:  Vitals:   08/22/24 0944  TempSrc: Oral  PainSc: 0-No pain                 Yvonna PARAS Beatryce Colombo      "

## 2024-08-29 ENCOUNTER — Encounter (HOSPITAL_COMMUNITY): Payer: Self-pay | Admitting: Gastroenterology

## 2024-08-30 NOTE — Progress Notes (Signed)
 10 yr TCS noted in recall Patient result letter mailed Patient's PCP is on EPIC

## 2024-09-07 ENCOUNTER — Ambulatory Visit (INDEPENDENT_AMBULATORY_CARE_PROVIDER_SITE_OTHER): Admitting: Family Medicine

## 2024-09-07 VITALS — BP 128/82 | HR 81 | Temp 97.6°F | Ht 66.0 in | Wt 208.0 lb

## 2024-09-07 DIAGNOSIS — R300 Dysuria: Secondary | ICD-10-CM | POA: Diagnosis not present

## 2024-09-07 DIAGNOSIS — R350 Frequency of micturition: Secondary | ICD-10-CM

## 2024-09-07 DIAGNOSIS — K5900 Constipation, unspecified: Secondary | ICD-10-CM | POA: Diagnosis not present

## 2024-09-07 LAB — URINALYSIS, ROUTINE W REFLEX MICROSCOPIC
Glucose, UA: NEGATIVE
Leukocytes,UA: NEGATIVE
Nitrite, UA: NEGATIVE
Protein,UA: NEGATIVE
RBC, UA: NEGATIVE
Specific Gravity, UA: 1.02 (ref 1.005–1.030)
Urobilinogen, Ur: 0.2 mg/dL (ref 0.2–1.0)
pH, UA: 5.5 (ref 5.0–7.5)

## 2024-09-07 LAB — MICROSCOPIC EXAMINATION
RBC, Urine: NONE SEEN /HPF (ref 0–2)
Renal Epithel, UA: NONE SEEN /HPF

## 2024-09-07 MED ORDER — SULFAMETHOXAZOLE-TRIMETHOPRIM 800-160 MG PO TABS: 1.0000 | ORAL_TABLET | Freq: Two times a day (BID) | ORAL | 0 refills | Status: AC

## 2024-09-07 NOTE — Progress Notes (Signed)
 "  Acute Office Visit  Patient ID: Caroline Leonard, female    DOB: [birth year], 64 y.o.   MRN: [id number]  PCP: Deitra Morton Sebastian Nena, NP  Chief Complaint  Patient presents with   Dysuria    Patient reports having increased urinary frequency, lower back pain, and burning when urinating for the past 5 days.    Subjective:     Dysuria     Discussed the use of AI scribe software for clinical note transcription with the patient, who gave verbal consent to proceed.  History of Present Illness   Caroline Leonard is a 64 year old female who presents with lower back pain and urinary symptoms x 5 days.   Lower back pain - Lower back pain present since Monday night - No associated trauma reported  Urinary symptoms x 5 days - Increased urinary frequency - Urinary urgency.  - Darkened urine since Monday night - Intermittent dysuria - No hematuria - Has been several years since last UTI, but the patient reports that current symptoms are consistent with previous UTI symptoms  Gastrointestinal symptoms - Constipation with bowel movements every two days - Stools are hard  Constitutional symptoms - No vomiting, diarrhea, or fever       Review of Systems  Genitourinary:  Positive for dysuria.       Objective:    BP 128/82 (Cuff Size: Normal)   Pulse 81   Temp 97.6 F (36.4 C)   Ht 5' 6 (1.676 m)   Wt 208 lb (94.3 kg)   SpO2 94%   BMI 33.57 kg/m    Physical Exam Vitals reviewed.  Constitutional:      Appearance: Normal appearance.  HENT:     Head: Normocephalic and atraumatic.  Eyes:     Extraocular Movements: Extraocular movements intact.     Conjunctiva/sclera: Conjunctivae normal.     Pupils: Pupils are equal, round, and reactive to light.  Cardiovascular:     Rate and Rhythm: Normal rate and regular rhythm.     Pulses: Normal pulses.     Heart sounds: Normal heart sounds. No murmur heard. Pulmonary:     Effort: Pulmonary effort is normal. No  respiratory distress.     Breath sounds: Normal breath sounds.  Abdominal:     Palpations: Abdomen is soft.  Musculoskeletal:        General: No deformity. Normal range of motion.     Cervical back: Normal range of motion.  Skin:    General: Skin is warm and dry.  Neurological:     General: No focal deficit present.     Mental Status: She is alert and oriented to person, place, and time.  Psychiatric:        Mood and Affect: Mood normal.        Behavior: Behavior normal.       No results found for any visits on 09/07/24.     Assessment & Plan:   Problem List Items Addressed This Visit   None Visit Diagnoses       Dysuria    -  Primary   Relevant Medications   sulfamethoxazole -trimethoprim  (BACTRIM  DS) 800-160 MG tablet   Other Relevant Orders   Urinalysis, Routine w reflex microscopic   Urine Culture     Urinary frequency         Constipation, unspecified constipation type           Assessment and Plan    Acute urinary tract infection Symptoms  suggest possible UTI.  - UA negative for nitrites, leuks, blood. Positive for +1 bili and trace ketones. Few bacteria present.  - Sent urine for culture, results pending.  - Despite UA being unremarkable I did send in empiric antibiotics for possible UTI due to patient's symptoms. - Will discontinue antibiotics if culture is negative.  Constipation - Discussed that constipation can cause urinary frequency/urgency. - Recommended Miralax, 1-2 capfuls daily as needed to achieve soft daily BM - Patient reports that she recently had a colonoscopy and it was normal.        Meds ordered this encounter  Medications   sulfamethoxazole -trimethoprim  (BACTRIM  DS) 800-160 MG tablet    Sig: Take 1 tablet by mouth 2 (two) times daily for 5 days.    Dispense:  10 tablet    Refill:  0    Supervising Provider:   JOLINDA NORENE HERO [[id number]]    Return if symptoms worsen or fail to improve.  Oneil LELON Severin, FNP [place]  [facility]   "

## 2024-09-07 NOTE — Patient Instructions (Signed)
 Follow up if symptoms acutely worsen, no improvement over the next 2-3 days, fever develops, or for any other concerns

## 2024-09-09 LAB — URINE CULTURE

## 2024-09-12 ENCOUNTER — Ambulatory Visit: Payer: Self-pay | Admitting: Family Medicine

## 2024-09-14 ENCOUNTER — Encounter: Payer: Self-pay | Admitting: Nurse Practitioner

## 2024-09-27 ENCOUNTER — Ambulatory Visit

## 2024-10-16 ENCOUNTER — Ambulatory Visit
